# Patient Record
Sex: Female | Born: 1990 | Race: Black or African American | Hispanic: No | Smoking: Former smoker
Health system: Southern US, Community
[De-identification: ages and names within clinical notes are randomized; demographics above are authoritative.]

## PROBLEM LIST (undated history)

## (undated) ENCOUNTER — Inpatient Hospital Stay (HOSPITAL_COMMUNITY): Payer: Self-pay

## (undated) DIAGNOSIS — Z9889 Other specified postprocedural states: Secondary | ICD-10-CM

## (undated) DIAGNOSIS — R42 Dizziness and giddiness: Secondary | ICD-10-CM

## (undated) DIAGNOSIS — R51 Headache: Secondary | ICD-10-CM

## (undated) DIAGNOSIS — D649 Anemia, unspecified: Secondary | ICD-10-CM

## (undated) DIAGNOSIS — E282 Polycystic ovarian syndrome: Secondary | ICD-10-CM

## (undated) DIAGNOSIS — O09299 Supervision of pregnancy with other poor reproductive or obstetric history, unspecified trimester: Secondary | ICD-10-CM

## (undated) HISTORY — DX: Polycystic ovarian syndrome: E28.2

## (undated) HISTORY — PX: ARM WOUND REPAIR / CLOSURE: SUR1141

## (undated) HISTORY — DX: Dizziness and giddiness: R42

## (undated) HISTORY — PX: INDUCED ABORTION: SHX677

---
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
## 2010-03-17 ENCOUNTER — Emergency Department (HOSPITAL_COMMUNITY)
Admission: EM | Admit: 2010-03-17 | Discharge: 2010-03-17 | Payer: Self-pay | Source: Home / Self Care | Admitting: Family Medicine

## 2010-03-17 LAB — POCT URINALYSIS DIPSTICK
Bilirubin Urine: NEGATIVE
Hgb urine dipstick: NEGATIVE
Nitrite: NEGATIVE
Specific Gravity, Urine: 1.015 (ref 1.005–1.030)
pH: >=9 (ref 5.0–8.0)

## 2010-05-05 ENCOUNTER — Inpatient Hospital Stay (INDEPENDENT_AMBULATORY_CARE_PROVIDER_SITE_OTHER)
Admission: RE | Admit: 2010-05-05 | Discharge: 2010-05-05 | Disposition: A | Discharge status: Home / Self Care | Payer: Self-pay | Source: Ambulatory Visit | Attending: Family Medicine | Admitting: Family Medicine

## 2010-05-05 DIAGNOSIS — K5289 Other specified noninfective gastroenteritis and colitis: Secondary | ICD-10-CM

## 2010-05-05 LAB — POCT URINALYSIS DIPSTICK
Bilirubin Urine: NEGATIVE
Glucose, UA: NEGATIVE mg/dL
Hgb urine dipstick: NEGATIVE
Nitrite: NEGATIVE
Specific Gravity, Urine: 1.02 (ref 1.005–1.030)
pH: 7 (ref 5.0–8.0)

## 2010-05-26 LAB — POCT URINALYSIS DIP (DEVICE)
Bilirubin Urine: NEGATIVE
Glucose, UA: NEGATIVE mg/dL
Hgb urine dipstick: NEGATIVE
Ketones, ur: 15 mg/dL — AB
Specific Gravity, Urine: 1.015 (ref 1.005–1.030)
pH: >=9 (ref 5.0–8.0)

## 2010-05-26 LAB — POCT I-STAT, CHEM 8
BUN: 15 mg/dL (ref 6–23)
Chloride: 107 mEq/L (ref 96–112)
Creatinine, Ser: 0.7 mg/dL (ref 0.4–1.2)
Glucose, Bld: 111 mg/dL — ABNORMAL HIGH (ref 70–99)
Hemoglobin: 16 g/dL — ABNORMAL HIGH (ref 12.0–15.0)
Potassium: 4 mEq/L (ref 3.5–5.1)
Sodium: 144 mEq/L (ref 135–145)

## 2010-05-26 LAB — URINE CULTURE: Colony Count: >=100000

## 2010-09-12 ENCOUNTER — Encounter (HOSPITAL_COMMUNITY): Payer: Self-pay | Admitting: *Deleted

## 2010-09-12 ENCOUNTER — Inpatient Hospital Stay (HOSPITAL_COMMUNITY)
Admission: AD | Admit: 2010-09-12 | Discharge: 2010-09-12 | Disposition: A | Discharge status: Home / Self Care | Payer: Self-pay | Source: Ambulatory Visit | Attending: Obstetrics & Gynecology | Admitting: Obstetrics & Gynecology

## 2010-09-12 DIAGNOSIS — N912 Amenorrhea, unspecified: Secondary | ICD-10-CM | POA: Insufficient documentation

## 2010-09-12 DIAGNOSIS — N949 Unspecified condition associated with female genital organs and menstrual cycle: Secondary | ICD-10-CM | POA: Insufficient documentation

## 2010-09-12 HISTORY — DX: Headache: R51

## 2010-09-12 LAB — URINALYSIS, ROUTINE W REFLEX MICROSCOPIC
Bilirubin Urine: NEGATIVE
Hgb urine dipstick: NEGATIVE
Ketones, ur: NEGATIVE mg/dL
Nitrite: NEGATIVE
Specific Gravity, Urine: 1.01 (ref 1.005–1.030)
Urobilinogen, UA: 0.2 mg/dL (ref 0.0–1.0)

## 2010-09-12 LAB — POCT PREGNANCY, URINE: Preg Test, Ur: NEGATIVE

## 2010-09-12 NOTE — Progress Notes (Signed)
Spotting May 28 after period until June 1 July no bleeding, negative UPT at home

## 2010-09-12 NOTE — Progress Notes (Signed)
Pt spoke to RN on unit and said she had an emergency and she had to leave immediately.  Left without signing@ 2006

## 2010-09-12 NOTE — Progress Notes (Signed)
Pt c/o clear, mucous like, stringy discharge and concerned that she has not had a cycle since the 14thof May

## 2010-12-22 ENCOUNTER — Ambulatory Visit
Admission: RE | Admit: 2010-12-22 | Discharge: 2010-12-22 | Disposition: A | Discharge status: Home / Self Care | Payer: PRIVATE HEALTH INSURANCE | Source: Ambulatory Visit | Attending: Emergency Medicine | Admitting: Emergency Medicine

## 2010-12-22 ENCOUNTER — Other Ambulatory Visit: Payer: Self-pay | Admitting: Emergency Medicine

## 2010-12-22 DIAGNOSIS — E282 Polycystic ovarian syndrome: Secondary | ICD-10-CM

## 2011-03-08 ENCOUNTER — Encounter (HOSPITAL_COMMUNITY): Payer: Self-pay | Admitting: *Deleted

## 2011-03-08 ENCOUNTER — Inpatient Hospital Stay (HOSPITAL_COMMUNITY)
Admission: AD | Admit: 2011-03-08 | Discharge: 2011-03-08 | Disposition: A | Discharge status: Home / Self Care | Payer: PRIVATE HEALTH INSURANCE | Source: Ambulatory Visit | Attending: Obstetrics and Gynecology | Admitting: Obstetrics and Gynecology

## 2011-03-08 DIAGNOSIS — Z3201 Encounter for pregnancy test, result positive: Secondary | ICD-10-CM | POA: Insufficient documentation

## 2011-03-08 DIAGNOSIS — J45909 Unspecified asthma, uncomplicated: Secondary | ICD-10-CM | POA: Insufficient documentation

## 2011-03-08 DIAGNOSIS — O09899 Supervision of other high risk pregnancies, unspecified trimester: Secondary | ICD-10-CM

## 2011-03-08 NOTE — Progress Notes (Signed)
Pt states she has had 3 positive preg test at home and she just wants to make sure and "get the information that I need because I haven't been preg before"

## 2011-03-08 NOTE — ED Provider Notes (Signed)
History   Ann Dunn is a 20y.o. Black female who presents unannounced at 4.6 weeks per LMP 02/02/11 for "pregnancy confirmation."  Has had "3" positive UPT's at home.  Denies VB, abnl d/c, LOF.  Reports occasional lower abdominal, menstrual-like "cramps."  Accompanied by her significant other.  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  Not in school.  Denies any significant PMH except asthma.  Only sx hx is EAB in past.  Dr. Estanislado Pandy is her gynecologist at [facility].  Called and spoke w/ "Vernona Rieger," r/e pos pregnancy test and pt was told the office would schedule her an appt around 6-10 weeks.  Denies cig, illegal drug use, or alcohol.  Taking an OTC PNV.  Pt's significant other has children already.  Denies morning sickness or UTI s/s.  Some constipation--last bm small today.    Chief Complaint  Patient presents with  . Possible Pregnancy   HPI  OB History    Grav Para Term Preterm Abortions TAB SAB Ect Mult Living   2    1 1     0      Past Medical History  Diagnosis Date  . Infection   . Asthma   . Headache     History reviewed. No pertinent past surgical history.  Family History  Problem Relation Age of Onset  . Stroke Maternal Grandmother     History  Substance Use Topics  . Smoking status: Current Everyday Smoker -- 0.2 packs/day  . Smokeless tobacco: Not on file  . Alcohol Use: 0.5 oz/week    1 drink(s) per week    Allergies: No Known Allergies  Prescriptions prior to admission  Medication Sig Dispense Refill  . Prenatal Vit-Fe Fumarate-FA (PRENATAL MULTIVITAMIN) TABS Take 1 tablet by mouth daily.        ROS--see HPI Physical Exam   Blood pressure 119/67, pulse 91, temperature 99.1 F (37.3 C), resp. rate 18, height 5' (1.524 m), weight 62.143 kg (137 lb), last menstrual period 02/02/2011, SpO2 97.00%.  Physical Exam  Constitutional: She is oriented to person, place, and time. She appears well-developed and well-nourished.  No distress.  Cardiovascular: Normal rate.   Respiratory: Effort normal.  Neurological: She is alert and oriented to person, place, and time.  Skin: Skin is warm and dry.    MAU Course  Procedures 1.  UPT--positive  Assessment and Plan  1.  Early pregnant--4.6 weeks per LMP 2.  Lack of knowledge r/e pregnancy condition 3.  Stable asthma 4.  Desiring pregnancy confirmation  1.  D/c home w/ several educational handouts r/e pregnancy, 1st trimester, etc 2.  SAB precautions rev'd 3.  Rec'd healthy lifestyle, adeq H2O, fruits and vegetables, small freq meals; Routines rev'd 4.  Office to call and schedule her NOB interview and workup, or pt to f/u prn 5.  Reports "[facility]," but desires confirmation to take info to "social services" 6.  Disc'd with pt, further testing in absence of worrisome s/s, not rec'd at her gestation  Antonietta Breach 03/08/2011, 11:02 PM

## 2011-04-03 ENCOUNTER — Encounter (HOSPITAL_COMMUNITY): Payer: Self-pay | Admitting: *Deleted

## 2011-04-03 ENCOUNTER — Inpatient Hospital Stay (HOSPITAL_COMMUNITY): Payer: PRIVATE HEALTH INSURANCE

## 2011-04-03 ENCOUNTER — Inpatient Hospital Stay (HOSPITAL_COMMUNITY)
Admission: AD | Admit: 2011-04-03 | Discharge: 2011-04-03 | Disposition: A | Discharge status: Home / Self Care | Payer: PRIVATE HEALTH INSURANCE | Source: Ambulatory Visit | Attending: Obstetrics & Gynecology | Admitting: Obstetrics & Gynecology

## 2011-04-03 DIAGNOSIS — J45909 Unspecified asthma, uncomplicated: Secondary | ICD-10-CM | POA: Insufficient documentation

## 2011-04-03 DIAGNOSIS — IMO0002 Reserved for concepts with insufficient information to code with codable children: Secondary | ICD-10-CM

## 2011-04-03 DIAGNOSIS — O021 Missed abortion: Secondary | ICD-10-CM | POA: Insufficient documentation

## 2011-04-03 DIAGNOSIS — R1084 Generalized abdominal pain: Secondary | ICD-10-CM

## 2011-04-03 DIAGNOSIS — F17200 Nicotine dependence, unspecified, uncomplicated: Secondary | ICD-10-CM | POA: Insufficient documentation

## 2011-04-03 DIAGNOSIS — F1010 Alcohol abuse, uncomplicated: Secondary | ICD-10-CM | POA: Insufficient documentation

## 2011-04-03 LAB — WET PREP, GENITAL
Clue Cells Wet Prep HPF POC: NONE SEEN
Trich, Wet Prep: NONE SEEN

## 2011-04-03 LAB — CBC
HCT: 35.6 % — ABNORMAL LOW (ref 36.0–46.0)
Platelets: 265 10*3/uL (ref 150–400)
RBC: 4.3 MIL/uL (ref 3.87–5.11)
RDW: 13.5 % (ref 11.5–15.5)
WBC: 10.4 10*3/uL (ref 4.0–10.5)

## 2011-04-03 LAB — ABO/RH: ABO/RH(D): O POS

## 2011-04-03 NOTE — ED Provider Notes (Signed)
History     CSN: [id number]  Arrival date & time 04/03/11  1007   None     No chief complaint on file.  HPI Ann Dunn is a 21 y.o. female @ [redacted]w[redacted]d gestation who presents to MAU for pregnancy follow up. States she had an appointment with [facility]/OB today for ultrasound and to start prenatal care. She was late for her appointment and told she would need to reschedule for 2 weeks. She asked for her records and told them she would not be getting her care with them. She comes to MAU stating that she has had cramping in her lower abdomen and needs to be evaluated. She denies vaginal bleeding.   Past Medical History  Diagnosis Date  . Infection   . Asthma   . Headache     Past Surgical History  Procedure Date  . Arm wound repair / closure   . Induced abortion     Family History  Problem Relation Age of Onset  . Stroke Maternal Grandmother     History  Substance Use Topics  . Smoking status: Current Everyday Smoker -- 0.2 packs/day  . Smokeless tobacco: Not on file  . Alcohol Use: 0.5 oz/week    1 drink(s) per week    OB History    Grav Para Term Preterm Abortions TAB SAB Ect Mult Living   2    1 1     0      Review of Systems  Constitutional: Negative for fever, chills, diaphoresis and fatigue.  HENT: Negative for ear pain, congestion, sore throat, facial swelling, neck pain, neck stiffness, dental problem and sinus pressure.   Eyes: Negative for photophobia, pain and discharge.  Respiratory: Negative for cough, chest tightness and wheezing.   Gastrointestinal: Positive for nausea and abdominal pain (cramping). Negative for vomiting, diarrhea, constipation and abdominal distention.  Genitourinary: Positive for frequency and pelvic pain. Negative for dysuria, flank pain, vaginal bleeding, vaginal discharge, difficulty urinating and vaginal pain.  Musculoskeletal: Negative for myalgias, back pain and gait problem.  Skin: Negative for color change and rash.  Neurological:  Negative for dizziness, speech difficulty, weakness, light-headedness, numbness and headaches.  Psychiatric/Behavioral: Negative for confusion and agitation. The patient is not nervous/anxious.     Allergies  Review of patient's allergies indicates no known allergies.  Home Medications  No current outpatient prescriptions on file.  BP 122/72  Pulse 65  Temp(Src) 97.1 F (36.2 C) (Oral)  Resp 18  Ht 5' (1.524 m)  Wt 129 lb (58.514 kg)  BMI 25.19 kg/m2  LMP 02/02/2011  Physical Exam  Nursing note and vitals reviewed. Constitutional: She is oriented to person, place, and time. She appears well-developed and well-nourished.  HENT:  Head: Normocephalic.  Eyes: EOM are normal.  Neck: Neck supple.  Cardiovascular: Normal rate.   Pulmonary/Chest: Effort normal.  Abdominal: Soft. There is no tenderness.  Genitourinary:       External genitalia without lesions. White discharge vaginal vault. Cervix closed, long. No CMT, no adnexal tenderness or mass palpated. Uterus slightly enlarged.   Musculoskeletal: Normal range of motion.  Neurological: She is alert and oriented to person, place, and time. No cranial nerve deficit.  Skin: Skin is warm and dry.  Psychiatric: She has a normal mood and affect. Her behavior is normal. Judgment and thought content normal.   Results for orders placed during the hospital encounter of 04/03/11 (from the past 24 hour(s))  CBC     Status: Abnormal  Collection Time   04/03/11 11:27 AM      Component Value Range   WBC 10.4  4.0 - 10.5 (K/uL)   RBC 4.30  3.87 - 5.11 (MIL/uL)   Hemoglobin 11.7 (*) 12.0 - 15.0 (g/dL)   HCT 16.1 (*) 09.6 - 46.0 (%)   MCV 82.8  78.0 - 100.0 (fL)   MCH 27.2  26.0 - 34.0 (pg)   MCHC 32.9  30.0 - 36.0 (g/dL)   RDW 04.5  40.9 - 81.1 (%)   Platelets 265  150 - 400 (K/uL)  ABO/RH     Status: Normal   Collection Time   04/03/11 11:30 AM      Component Value Range   ABO/RH(D) O POS    HCG, QUANTITATIVE, PREGNANCY     Status:  Abnormal   Collection Time   04/03/11 11:35 AM      Component Value Range   hCG, Beta Chain, Quant, S 91478 (*) <5 (mIU/mL)   Ultrasound today shows a 7 week 1 day IUFD.  Assessment: IUFD @ 7 weeks gest  Plan: Discussed options with patient and she wants to do expectant management at this time. She will follow up in 2 weeks or return sooner for problems.  ED Course  Procedures  MDM          Kerrie Buffalo, NP 04/03/11 1323

## 2011-04-03 NOTE — Progress Notes (Signed)
I'm preg, scheduled appt today with [facility].  Was late for appt, had to reschedule appt for 2 wks.  Pt is resigning from their practice and looking for another doctor.  Was to have had [place] today.

## 2011-04-04 LAB — GC/CHLAMYDIA PROBE AMP, GENITAL
Chlamydia, DNA Probe: NEGATIVE
GC Probe Amp, Genital: NEGATIVE

## 2011-04-13 ENCOUNTER — Encounter (HOSPITAL_COMMUNITY): Payer: Self-pay | Admitting: *Deleted

## 2011-04-13 ENCOUNTER — Inpatient Hospital Stay (HOSPITAL_COMMUNITY)
Admission: AD | Admit: 2011-04-13 | Discharge: 2011-04-13 | Disposition: A | Discharge status: Home / Self Care | Payer: PRIVATE HEALTH INSURANCE | Attending: Obstetrics and Gynecology | Admitting: Obstetrics and Gynecology

## 2011-04-13 DIAGNOSIS — O021 Missed abortion: Secondary | ICD-10-CM

## 2011-04-13 LAB — CBC
Hemoglobin: 11.7 g/dL — ABNORMAL LOW (ref 12.0–15.0)
MCH: 27.3 pg (ref 26.0–34.0)
RBC: 4.29 MIL/uL (ref 3.87–5.11)

## 2011-04-13 MED ORDER — HYDROCODONE-ACETAMINOPHEN 5-500 MG PO TABS: 1.0000 | ORAL_TABLET | ORAL | Status: AC | PRN

## 2011-04-13 MED ORDER — IBUPROFEN 600 MG PO TABS
600.0000 mg | ORAL_TABLET | Freq: Once | ORAL | Status: AC
  Administered 2011-04-13: 600 mg via ORAL
  Filled 2011-04-13: qty 1

## 2011-04-13 MED ORDER — ONDANSETRON 8 MG PO TBDP: 8.0000 mg | ORAL_TABLET | Freq: Three times a day (TID) | ORAL | Status: AC | PRN

## 2011-04-13 MED ORDER — HYDROCODONE-ACETAMINOPHEN 5-325 MG PO TABS
1.0000 | ORAL_TABLET | Freq: Once | ORAL | Status: AC
  Administered 2011-04-13: 1 via ORAL
  Filled 2011-04-13: qty 1

## 2011-04-13 NOTE — Discharge Instructions (Signed)
Miscarriage (Spontaneous Miscarriage) A miscarriage is when you lose your baby before the twentieth week of pregnancy. Miscarriages happen in 15-20% of pregnancies. Most miscarriages happen in the first 13 weeks of the pregnancy. In medical terms, this is called a spontaneous miscarriage or early pregnancy loss. No further treatment is needed when the miscarriage is complete and all products of conception have been passed out of the body. You can begin trying for another pregnancy as soon as your caregiver says it is okay. CAUSES   Most causes are not known.   Genetic problems like abnormal, not enough or too many chromosomes.   Infection of the cervix or uterus.   An abnormal shaped uterus, fibroid tumors or congenital abnormalities.   Hormone problems.   Medical problems.   Incompetent cervix, the tissue in the cervix is not strong enough to hold the pregnancy.   Smoking, too much alcohol use and illegal drugs.   Trauma.  SYMPTOMS   Bleeding or spotting from the vagina.   Cramping of the lower abdomen.   Passing of fluid from the vagina with or without cramps or pain.   Passing fetal tissue.  TREATMENT   Sometimes no further treatment is necessary if you pass all the tissue in the uterus.   If partial parts of the fetus or placenta remain in the body (incomplete miscarriage), tissue left behind may become infected. Usually a D and C (Dilatation and Curettage) suction or scrapping of the uterus is necessary to remove the remaining tissue in uterus. The procedure is only done when your caregiver knows that there is no chance for the pregnancy to continue. This is determined by a physical exam, a negative pregnancy test, blood tests and perhaps an ultrasound revealing a dead fetus or no fetus developing because a problem occurred at conception (when the sperm and egg unite).   Medications may be necessary, antibiotics if there is an infection or medications to contract the uterus  if there is a lot of bleeding.   If you have Rh negative blood and your partner is Rh positive, you will need a Rho-gam shot (an immune globulin vaccine). This will protect your baby from having Rh blood problems in future pregnancies.  HOME CARE INSTRUCTIONS   Your caregiver may order bed rest (up to the bathroom only). He or she may allow you to continue light activity. You may need to make arrangements for the care of children and for any other responsibilities.   Keep track of the number of pads you use each day and how soaked (saturated) they are. Record this information.   Do not use tampons. Do not douche or have sexual intercourse until approved by your caregiver.   Only take over-the-counter or prescription medicines for pain, discomfort or fever as directed by your caregiver.   Do not take aspirin because it can cause bleeding.   It is very important to keep all follow-up appointments for re-evaluations and continuing management.   Tell your caregiver if you are experiencing domestic violence.   Women who have an Rh negative blood type (i.e., A, B, AB, or O negative) need to receive a drug called Rh(D) immune globulin (RhoGam). This medicine helps protect future fetuses against problems that can occur if an Rh negative mother is carrying a baby who is Rh positive.   If you and/or your partner are having problems with guilt or grieving, talk to your caregiver or seek counseling to help you cope with the pregnancy loss.   Allow enough time to grieve before trying to get pregnant again.  SEEK IMMEDIATE MEDICAL CARE IF:   You have severe cramps or pain in your stomach, back, or belly (abdomen).   You have a fever.   You pass large clots or tissue. Save any tissue for your caregiver to inspect.   Your bleeding increases.   You become light-headed, weak or have fainting episodes.   You develop chills.  Document Released: 08/02/2000 Document Revised: 10/19/2010 Document Reviewed:  09/09/2007 Exit[employer] Patient Information 2012 [employer]. 

## 2011-04-13 NOTE — ED Provider Notes (Signed)
History   Ann Dunn is a 20y.o. Single black female at [redacted] weeks gestation who presents unannounced via EMS w/ CC of heavy vaginal bleeding and painful cramping s/p vaginal cytotec at 1930 for induction of MAB.  IUFD dx'd on 04/03/11 in MAU (fetal pole measuring 7.1 weeks), and pt had f/u appt w/ Dr. Estanislado Pandy yesterday and chose to proceed w/ cytotec.  Pt reports heaving vaginal bleeding, but unsure true amt b/c pt didn't have any feminine pads at home to use, and was evaluating bleeding based on "rolled up tissue paper" she had placed in underwear.  She also reports "diarrhea" several times since cytotec taken.  Denies any recent fever or illness.  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  Pt reports taking "one" ibuprofen OTC tablet around 11pm.  She denies any dizziness or light-headedness.  States decreased urge to void since cytotec last night.  Pt had negative Gc/CT cx's and negative wet prep on 04/03/11.  Blood type is O pos.  Prior pregnancy was EAB.  Last saw pt myself for pregnancy confirmation just before 5 weeks in MAU.  At that time had c/o intermittent menstrual-like cramping, but no VB.  Pt's boyfriend did eventually present to bedside in MAU.    Chief Complaint  Patient presents with  . Abdominal Pain  . Vaginal Bleeding   HPI  OB History    Grav Para Term Preterm Abortions TAB SAB Ect Mult Living   2    1 1     0      Past Medical History  Diagnosis Date  . Infection   . Asthma   . Headache     Past Surgical History  Procedure Date  . Arm wound repair / closure   . Induced abortion     Family History  Problem Relation Age of Onset  . Stroke Maternal Grandmother     History  Substance Use Topics  . Smoking status: Current Everyday Smoker -- 0.2 packs/day  . Smokeless tobacco: Not on file  . Alcohol Use: 0.5 oz/week    1 drink(s) per week    Allergies: No Known Allergies  Prescriptions prior to admission  Medication  Sig Dispense Refill  . ibuprofen (ADVIL,MOTRIN) 200 MG tablet Take 400 mg by mouth every 6 (six) hours as needed. For pain      . Prenatal Vit-Fe Fumarate-FA (PRENATAL MULTIVITAMIN) TABS Take 1 tablet by mouth daily.      Marland Kitchen PRESCRIPTION MEDICATION Place 800 mcg vaginally once. Cytotec vaginal suppository inserted as directed by MD      . fluticasone (FLOVENT HFA) 110 MCG/ACT inhaler Inhale 1 puff into the lungs 2 (two) times daily as needed. Wheezing/shortness of breath        ROS--see history above Physical Exam  .. Results for orders placed during the hospital encounter of 04/13/11 (from the past 24 hour(s))  CBC     Status: Abnormal   Collection Time   04/13/11  1:35 AM      Component Value Range   WBC 17.9 (*) 4.0 - 10.5 (K/uL)   RBC 4.29  3.87 - 5.11 (MIL/uL)   Hemoglobin 11.7 (*) 12.0 - 15.0 (g/dL)   HCT 16.1 (*) 09.6 - 46.0 (%)   MCV 82.8  78.0 - 100.0 (fL)   MCH 27.3  26.0 - 34.0 (pg)   MCHC 33.0  30.0 - 36.0 (g/dL)   RDW 04.5  40.9 - 81.1 (%)  Platelets 222  150 - 400 (K/uL)   Blood pressure 123/75, pulse 71, temperature 97.3 F (36.3 C), temperature source Oral, resp. rate 16, last menstrual period 02/02/2011.  Physical Exam  Constitutional: She is oriented to person, place, and time. She appears well-developed and well-nourished. No distress.  Cardiovascular: Normal rate.   Respiratory: Effort normal.  GI: Soft. Bowel sounds are normal. She exhibits no distension and no mass. There is no tenderness. There is no rebound and no guarding.  Genitourinary:       SSE:  lg amt of tissue in os (POC) and removed w/ ring forceps and sent to path. Following removal, oozing noted from os. Continued Pad count for 3+ hours following Spec exam, and bleeding to slow down over time and cramping improved after Motrin  Neurological: She is alert and oriented to person, place, and time.  Skin: Skin is warm and dry.    MAU Course  Procedures 1.  Spec exam 2.  CBC 3.  Motrin 600mg   po x1 4.  Removal of large amt of POC from cervical os and sent to path 5.  Pad count for 3-4 hrs post Spec exam  Assessment and Plan  1.  MAB 2.  Induction w/ Cytotec last night 3.  CBC stable and VB decreased w/ time in MAU 4.  Cramping improved w/ Motrin 5.  Rh pos  1.  D/c'd home after several hours of observation and VB stable 2.  Rx called in for Motrin and Zofran 3.  Bleeding precautions rev'd; enc'd pt to have adeq fluid intake and slow movement if dizzy 4.  F/u as scheduled at [facility] or prn worsening s/s, or concerns  Raeanne Deschler H 04/13/2011, 1:55 AM

## 2011-04-13 NOTE — Progress Notes (Signed)
Pt took cytotec at 1930 this evening for MAB, having lower abd cramping and vag bleeding.  Pt took ibuprofen for pain at 2300.  Pt arrived via EMS.

## 2011-04-13 NOTE — ED Notes (Signed)
H. Steelman [name] in to see pt. 

## 2011-04-27 ENCOUNTER — Encounter (INDEPENDENT_AMBULATORY_CARE_PROVIDER_SITE_OTHER): Payer: Medicaid Other | Admitting: Obstetrics and Gynecology

## 2011-04-27 DIAGNOSIS — O035 Genital tract and pelvic infection following complete or unspecified spontaneous abortion: Secondary | ICD-10-CM

## 2011-07-07 ENCOUNTER — Encounter (HOSPITAL_COMMUNITY): Payer: Self-pay | Admitting: Emergency Medicine

## 2011-07-07 ENCOUNTER — Emergency Department (INDEPENDENT_AMBULATORY_CARE_PROVIDER_SITE_OTHER)
Admission: EM | Admit: 2011-07-07 | Discharge: 2011-07-07 | Disposition: A | Discharge status: Home / Self Care | Payer: PRIVATE HEALTH INSURANCE | Source: Home / Self Care | Attending: Family Medicine | Admitting: Family Medicine

## 2011-07-07 DIAGNOSIS — IMO0002 Reserved for concepts with insufficient information to code with codable children: Secondary | ICD-10-CM

## 2011-07-07 DIAGNOSIS — N941 Unspecified dyspareunia: Secondary | ICD-10-CM

## 2011-07-07 LAB — POCT URINALYSIS DIP (DEVICE)
Glucose, UA: NEGATIVE mg/dL
Hgb urine dipstick: NEGATIVE
Specific Gravity, Urine: >=1.03 (ref 1.005–1.030)

## 2011-07-07 MED ORDER — DICLOFENAC POTASSIUM 50 MG PO TABS: 50.0000 mg | ORAL_TABLET | Freq: Three times a day (TID) | ORAL | Status: DC

## 2011-07-07 NOTE — Discharge Instructions (Signed)
Use medicine as needed , drink plenty of fluids and see your doctor if further problems.

## 2011-07-07 NOTE — ED Notes (Signed)
Pt having "contraction like" pain, and vomiting, dark urine, and frequency. No dysuria.
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
## 2011-10-31 ENCOUNTER — Telehealth: Payer: Self-pay | Admitting: Obstetrics and Gynecology

## 2011-10-31 NOTE — Telephone Encounter (Signed)
[name]/APPT REQ

## 2011-10-31 NOTE — Telephone Encounter (Signed)
LMP 09-24-11  5 w 2 d today.   EDC of May 12,2014. Pt stated you wanted to know as soon as she had a positive UPT.  Had SAB in Feb.  ld

## 2011-10-31 NOTE — Telephone Encounter (Signed)
LM for pt to [facility].  ld

## 2011-10-31 NOTE — Telephone Encounter (Signed)
[name]/RETURN CALL

## 2011-11-02 ENCOUNTER — Telehealth: Payer: Self-pay | Admitting: Obstetrics and Gynecology

## 2011-11-02 NOTE — Telephone Encounter (Signed)
Needs quant HCG and progesterone

## 2011-11-03 ENCOUNTER — Other Ambulatory Visit: Payer: Self-pay

## 2011-11-03 ENCOUNTER — Telehealth: Payer: Self-pay

## 2011-11-03 DIAGNOSIS — Z3201 Encounter for pregnancy test, result positive: Secondary | ICD-10-CM

## 2011-11-03 NOTE — Telephone Encounter (Signed)
Needs quant and progesterone level done per SR.  LM for pt that labs need to be scheduled.  ld

## 2011-11-03 NOTE — Telephone Encounter (Signed)
Quant and prog per SR.  Pt to come to lab today. ld

## 2011-11-04 LAB — HCG, QUANTITATIVE, PREGNANCY: hCG, Beta Chain, Quant, S: 19362.3 m[IU]/mL

## 2011-11-04 LAB — PROGESTERONE: Progesterone: 28.1 ng/mL

## 2011-11-05 ENCOUNTER — Inpatient Hospital Stay (HOSPITAL_COMMUNITY)
Admission: AD | Admit: 2011-11-05 | Discharge: 2011-11-05 | Disposition: A | Discharge status: Home / Self Care | Payer: Medicaid Other | Source: Ambulatory Visit | Attending: Obstetrics & Gynecology | Admitting: Obstetrics & Gynecology

## 2011-11-05 ENCOUNTER — Encounter (HOSPITAL_COMMUNITY): Payer: Self-pay | Admitting: *Deleted

## 2011-11-05 DIAGNOSIS — Z3201 Encounter for pregnancy test, result positive: Secondary | ICD-10-CM

## 2011-11-05 NOTE — MAU Provider Note (Signed)
  History     CSN: [id number]  Arrival date and time: 11/05/11 1109   First Provider Initiated Contact with Patient 11/05/11 1141      Chief Complaint  Patient presents with  . Possible Pregnancy   HPI Ann Dunn 21 y.o. [redacted]w[redacted]d  Here for pregnancy confirmation only.  No pain.  No bleeding.  Needs to apply for Medicaid.  OB History    Grav Para Term Preterm Abortions TAB SAB Ect Mult Living   3    2 1 1    0      Past Medical History  Diagnosis Date  . Infection   . Asthma   . Headache     Past Surgical History  Procedure Date  . Arm wound repair / closure   . Induced abortion     Family History  Problem Relation Age of Onset  . Stroke Maternal Grandmother     History  Substance Use Topics  . Smoking status: Current Every Day Smoker -- 0.2 packs/day  . Smokeless tobacco: Not on file  . Alcohol Use: 0.5 oz/week    1 drink(s) per week    Allergies: No Known Allergies  Prescriptions prior to admission  Medication Sig Dispense Refill  . Prenatal Vit-Fe Fumarate-FA (PRENATAL MULTIVITAMIN) TABS Take 1 tablet by mouth daily.      . fluticasone (FLOVENT HFA) 110 MCG/ACT inhaler Inhale 1 puff into the lungs 2 (two) times daily as needed. Wheezing/shortness of breath        Review of Systems  Constitutional: Negative for fever.  Gastrointestinal: Positive for nausea. Negative for vomiting and abdominal pain.  Genitourinary:       No vaginal discharge. No vaginal bleeding. No dysuria.   Physical Exam   Blood pressure 126/65, pulse 85, temperature 98.1 F (36.7 C), temperature source Oral, resp. rate 18, height 5' 0.25" (1.53 m), weight 63.957 kg (141 lb), last menstrual period 09/24/2011.  Physical Exam  Nursing note and vitals reviewed. Constitutional: She is oriented to person, place, and time. She appears well-developed and well-nourished. No distress.  HENT:  Head: Normocephalic.  Eyes: EOM are normal.  Neck: Neck supple.  Musculoskeletal: Normal  range of motion.  Neurological: She is alert and oriented to person, place, and time.  Skin: Skin is warm and dry.  Psychiatric: She has a normal mood and affect.    MAU Course  Procedures Results for orders placed during the hospital encounter of 11/05/11 (from the past 24 hour(s))  POCT PREGNANCY, URINE     Status: Abnormal   Collection Time   11/05/11 11:24 AM      Component Value Range   Preg Test, Ur POSITIVE (*) NEGATIVE   MDM   Assessment and Plan  Positive pregnancy test  Plan Begin prenatal care as soon as possible. Pregnancy verification form given  BURLESON,TERRI 11/05/2011, 12:10 PM

## 2011-11-05 NOTE — MAU Note (Signed)
Pt here for pregnancy confirmation. Had miscarriage back in Feb

## 2011-11-06 ENCOUNTER — Telehealth: Payer: Self-pay

## 2011-11-06 DIAGNOSIS — O26849 Uterine size-date discrepancy, unspecified trimester: Secondary | ICD-10-CM

## 2011-11-06 NOTE — Telephone Encounter (Signed)
Pt notified of lab results.  Pt scheduled for u/s on 11-15-11 with f/u per SR.  Pt agreeable.  ld

## 2011-11-06 NOTE — Telephone Encounter (Signed)
Message copied by Larwance Rote on Mon Nov 06, 2011  8:58 AM ------      Message from: Ann Dunn      Created: Sat Nov 04, 2011  3:39 PM       Please call pt to inform labs are great. Schedule dating ultrasound at 7 weeks per LMP

## 2011-11-15 ENCOUNTER — Encounter: Payer: Self-pay | Admitting: Obstetrics and Gynecology

## 2011-11-15 ENCOUNTER — Other Ambulatory Visit: Payer: Self-pay | Admitting: Obstetrics and Gynecology

## 2011-11-15 ENCOUNTER — Ambulatory Visit (INDEPENDENT_AMBULATORY_CARE_PROVIDER_SITE_OTHER): Payer: Medicaid Other

## 2011-11-15 ENCOUNTER — Ambulatory Visit (INDEPENDENT_AMBULATORY_CARE_PROVIDER_SITE_OTHER): Payer: Medicaid Other | Admitting: Obstetrics and Gynecology

## 2011-11-15 VITALS — BP 100/62 | Wt 141.0 lb

## 2011-11-15 DIAGNOSIS — O26849 Uterine size-date discrepancy, unspecified trimester: Secondary | ICD-10-CM

## 2011-11-15 DIAGNOSIS — F1290 Cannabis use, unspecified, uncomplicated: Secondary | ICD-10-CM

## 2011-11-15 DIAGNOSIS — J45909 Unspecified asthma, uncomplicated: Secondary | ICD-10-CM

## 2011-11-15 DIAGNOSIS — F1210 Cannabis abuse, uncomplicated: Secondary | ICD-10-CM

## 2011-11-15 LAB — US OB TRANSVAGINAL

## 2011-11-15 NOTE — Progress Notes (Signed)
[redacted]w[redacted]d Dating u/s today  Needs NOB interview and work up NOB bag with pnv samples given today

## 2011-11-15 NOTE — Progress Notes (Signed)
[place] today for dating:  SIUP, 6 5/7 weeks, EDC 07/06/11, c/w dates.  FHR 138.  Normal ovaries and adenexa. Per patient, cycles have been 30-32 days, regular. Will keep EDC per LMP, 06/30/12. Schedule NOB interview in 2-3 weeks.

## 2011-11-27 ENCOUNTER — Inpatient Hospital Stay (HOSPITAL_COMMUNITY)
Admission: AD | Admit: 2011-11-27 | Discharge: 2011-11-27 | Disposition: A | Discharge status: Home / Self Care | Payer: Medicaid Other | Source: Ambulatory Visit | Attending: Obstetrics and Gynecology | Admitting: Obstetrics and Gynecology

## 2011-11-27 ENCOUNTER — Encounter (HOSPITAL_COMMUNITY): Payer: Self-pay | Admitting: *Deleted

## 2011-11-27 DIAGNOSIS — K5900 Constipation, unspecified: Secondary | ICD-10-CM | POA: Insufficient documentation

## 2011-11-27 DIAGNOSIS — O99330 Smoking (tobacco) complicating pregnancy, unspecified trimester: Secondary | ICD-10-CM

## 2011-11-27 DIAGNOSIS — O99891 Other specified diseases and conditions complicating pregnancy: Secondary | ICD-10-CM | POA: Insufficient documentation

## 2011-11-27 DIAGNOSIS — O09299 Supervision of pregnancy with other poor reproductive or obstetric history, unspecified trimester: Secondary | ICD-10-CM

## 2011-11-27 DIAGNOSIS — Z9889 Other specified postprocedural states: Secondary | ICD-10-CM | POA: Insufficient documentation

## 2011-11-27 DIAGNOSIS — F1290 Cannabis use, unspecified, uncomplicated: Secondary | ICD-10-CM

## 2011-11-27 DIAGNOSIS — R109 Unspecified abdominal pain: Secondary | ICD-10-CM

## 2011-11-27 DIAGNOSIS — J45909 Unspecified asthma, uncomplicated: Secondary | ICD-10-CM

## 2011-11-27 DIAGNOSIS — R1030 Lower abdominal pain, unspecified: Secondary | ICD-10-CM

## 2011-11-27 HISTORY — DX: Supervision of pregnancy with other poor reproductive or obstetric history, unspecified trimester: O09.299

## 2011-11-27 HISTORY — DX: Other specified postprocedural states: Z98.890

## 2011-11-27 LAB — WET PREP, GENITAL
Trich, Wet Prep: NONE SEEN
Yeast Wet Prep HPF POC: NONE SEEN

## 2011-11-27 LAB — URINALYSIS, ROUTINE W REFLEX MICROSCOPIC
Bilirubin Urine: NEGATIVE
Glucose, UA: NEGATIVE mg/dL
Hgb urine dipstick: NEGATIVE
Specific Gravity, Urine: >1.03 — ABNORMAL HIGH (ref 1.005–1.030)
pH: 6 (ref 5.0–8.0)

## 2011-11-27 LAB — GC/CHLAMYDIA PROBE AMP, GENITAL
Chlamydia, DNA Probe: NEGATIVE
GC Probe Amp, Genital: NEGATIVE

## 2011-11-27 MED ORDER — ACETAMINOPHEN 500 MG PO TABS
1000.0000 mg | ORAL_TABLET | Freq: Once | ORAL | Status: AC
  Administered 2011-11-27: 1000 mg via ORAL
  Filled 2011-11-27: qty 2

## 2011-11-27 NOTE — MAU Note (Signed)
Pt reports lower abd cramping since yesterday, pain awakened her this am. Some discomfort with urination. Denies bleeding

## 2011-11-27 NOTE — MAU Provider Note (Signed)
History   Ann Dunn is a 21y.o. BF at [redacted]w[redacted]d who presents unannounced w/ CC of lower abdominal cramping that awoke her out of her sleep this morning.  It had been intermittent since yesterday, but when awoke her and persisted, she got more concerned and came in for eval.  Has tried nothing for cramping.  Hx r/f SAB in Feb 2013.  C/o constipation, occ'l n/v (especially after dairy w/ no previous h/o lactose intolerance).  No fever or illness.  Denies VB.  Works in a "call center."  Has had viability u/s on 9/25 w/ normal progesterone level.   .. Patient Active Problem List  Diagnosis  . Marijuana use  . Asthma  . H/O spontaneous abortion, currently pregnant  . H/O induced abortion  . Smoker    CSN: [id number]  Arrival date and time: 11/27/11 0544   First Provider Initiated Contact with Patient 11/27/11 [phone]      Chief Complaint  Patient presents with  . Abdominal Pain   HPI  OB History    Grav Para Term Preterm Abortions TAB SAB Ect Mult Living   3    2 1 1    0      Past Medical History  Diagnosis Date  . Infection   . Asthma   . Headache     Past Surgical History  Procedure Date  . Arm wound repair / closure   . Induced abortion     Family History  Problem Relation Age of Onset  . Stroke Maternal Grandmother     History  Substance Use Topics  . Smoking status: Current Every Day Smoker -- 0.2 packs/day  . Smokeless tobacco: Not on file  . Alcohol Use: 0.5 oz/week    1 drink(s) per week     dc'd +UPT    Allergies: No Known Allergies  Prescriptions prior to admission  Medication Sig Dispense Refill  . Prenatal Vit-Fe Fumarate-FA (PRENATAL MULTIVITAMIN) TABS Take 1 tablet by mouth daily.      . fluticasone (FLOVENT HFA) 110 MCG/ACT inhaler Inhale 1 puff into the lungs 2 (two) times daily as needed. Wheezing/shortness of breath        ROS--see HPI Physical Exam   Blood pressure 102/54, pulse 75, temperature 98 F (36.7 C), temperature source Oral,  resp. rate 18, height 5' (1.524 m), weight 139 lb (63.05 kg), last menstrual period 09/24/2011, SpO2 100.00%. .. Results for orders placed during the hospital encounter of 11/27/11 (from the past 24 hour(s))  URINALYSIS, ROUTINE W REFLEX MICROSCOPIC     Status: Abnormal   Collection Time   11/27/11  6:25 AM      Component Value Range   Color, Urine YELLOW  YELLOW   APPearance CLOUDY (*) CLEAR   Specific Gravity, Urine >1.030 (*) 1.005 - 1.030   pH 6.0  5.0 - 8.0   Glucose, UA NEGATIVE  NEGATIVE mg/dL   Hgb urine dipstick NEGATIVE  NEGATIVE   Bilirubin Urine NEGATIVE  NEGATIVE   Ketones, ur 15 (*) NEGATIVE mg/dL   Protein, ur NEGATIVE  NEGATIVE mg/dL   Urobilinogen, UA 0.2  0.0 - 1.0 mg/dL   Nitrite NEGATIVE  NEGATIVE   Leukocytes, UA SMALL (*) NEGATIVE  URINE MICROSCOPIC-ADD ON     Status: Abnormal   Collection Time   11/27/11  6:25 AM      Component Value Range   Squamous Epithelial / LPF MANY (*) RARE   WBC, UA 3-6  <3 WBC/hpf   Bacteria, UA  RARE  RARE  WET PREP, GENITAL     Status: Abnormal   Collection Time   11/27/11  6:54 AM      Component Value Range   Yeast Wet Prep HPF POC NONE SEEN  NONE SEEN   Trich, Wet Prep NONE SEEN  NONE SEEN   Clue Cells Wet Prep HPF POC MODERATE (*) NONE SEEN   WBC, Wet Prep HPF POC FEW (*) NONE SEEN   Physical Exam  Constitutional: She is oriented to person, place, and time. She appears well-developed and well-nourished. No distress.       Pt sleeping on my arrival to room initially  HENT:  Head: Normocephalic.  Eyes: Pupils are equal, round, and reactive to light.  Cardiovascular: Normal rate.   Respiratory: Effort normal.  GI: Soft. She exhibits no distension and no mass. There is no tenderness. There is no rebound and no guarding.  Genitourinary:       SSE:  Scant white d/c in vault; no whiff; cx closed  Neurological: She is alert and oriented to person, place, and time.  Skin: Skin is warm and dry.  Psychiatric: Her behavior is  normal. Thought content normal.    MAU Course  Procedures 1.  FHT's via doppler=166 (RLQ) 2.  Wet prep 3.  Gc/ct 4.  U/a 5. Tylenol 1000mg  po x1  Assessment and Plan  1. [redacted]w[redacted]d 2. Lower abdominal cramping 3.  H/o SABx1 & TAB x1 4.  Anxiety r/t previous SAB 5.  Constipation 6.  Inadequate liquid intake  1.  D/c home w/ SAB precautions; ABC's of pregnancy 2.  F/u 12/06/11 for NOB interview and PNL's, or prn 3.  Tylenol, warm compress/bath prn comfort; rec'd 8-10 glasses of water/day; 30mg  Fiber/day 4. Note given to remain OOW until tomorrow  Kimiah Hibner H 11/27/2011, 7:29 AM

## 2011-12-06 ENCOUNTER — Ambulatory Visit (INDEPENDENT_AMBULATORY_CARE_PROVIDER_SITE_OTHER): Payer: Medicaid Other | Admitting: Obstetrics and Gynecology

## 2011-12-06 DIAGNOSIS — Z331 Pregnant state, incidental: Secondary | ICD-10-CM

## 2011-12-06 MED ORDER — CITRANATAL HARMONY 30-1-260 MG PO CAPS: 1.0000 | ORAL_CAPSULE | Freq: Every day | ORAL | Status: DC

## 2011-12-06 MED ORDER — CITRANATAL HARMONY 29-1-265 MG PO CAPS: 1.0000 | ORAL_CAPSULE | Freq: Every day | ORAL | Status: DC

## 2011-12-07 LAB — PRENATAL PANEL VII
Basophils Absolute: 0 10*3/uL (ref 0.0–0.1)
Basophils Relative: 0 % (ref 0–1)
Eosinophils Absolute: 0.1 10*3/uL (ref 0.0–0.7)
Hepatitis B Surface Ag: NEGATIVE
MCH: 26.8 pg (ref 26.0–34.0)
MCHC: 33.2 g/dL (ref 30.0–36.0)
Neutrophils Relative %: 83 % — ABNORMAL HIGH (ref 43–77)
Platelets: 271 10*3/uL (ref 150–400)

## 2011-12-07 LAB — POCT URINALYSIS DIPSTICK
Bilirubin, UA: NEGATIVE
Blood, UA: NEGATIVE
Glucose, UA: NEGATIVE
Nitrite, UA: NEGATIVE

## 2011-12-07 NOTE — Progress Notes (Signed)
NOB interview completed.  Pt informed of ketones in urine and advised to make sure she is pushing fluids and eating well.  Pt requests heart tones today.  Per VL ok, FHR @ 160 bpm.  NOB work up scheduled Thursday 12/28/11 @ 0900 w/ VL.

## 2011-12-08 LAB — HEMOGLOBINOPATHY EVALUATION
Hemoglobin Other: 0 %
Hgb A2 Quant: 2.5 % (ref 2.2–3.2)
Hgb A: 97.5 % (ref 96.8–97.8)
Hgb F Quant: 0 % (ref 0.0–2.0)
Hgb S Quant: 0 %

## 2011-12-08 LAB — CULTURE, OB URINE

## 2011-12-11 ENCOUNTER — Encounter: Payer: Self-pay | Admitting: Obstetrics and Gynecology

## 2011-12-28 ENCOUNTER — Ambulatory Visit (INDEPENDENT_AMBULATORY_CARE_PROVIDER_SITE_OTHER): Payer: Medicaid Other | Admitting: Obstetrics and Gynecology

## 2011-12-28 VITALS — BP 104/68 | Wt 139.0 lb

## 2011-12-28 DIAGNOSIS — Z331 Pregnant state, incidental: Secondary | ICD-10-CM

## 2011-12-28 NOTE — Progress Notes (Signed)
Subjective:    Ann Dunn is being seen today for her first obstetrical visit at [redacted]w[redacted]d gestation by LMP and early [place] at [facility].  She reports doing well, no issues.  Her obstetrical history is significant for: Patient Active Problem List  Diagnosis  . Marijuana use  . Asthma  . H/O spontaneous abortion, currently pregnant  . H/O induced abortion  . Smoker    Relationship with FOB:  Silvestre Mesi, boyfriend--involved and supportive  Feeding plan:   Undecided  Pregnancy history fully reviewed.  The following portions of the patient's history were reviewed and updated as appropriate: allergies, current medications, past family history, past medical history, past social history, past surgical history and problem list.  Review of Systems Pertinent ROS is described in HPI   Objective:   LMP 09/24/2011 Wt Readings from Last 1 Encounters:  11/27/11 139 lb (63.05 kg)   BMI: There is no height or weight on file to calculate BMI.  General: alert, cooperative and no distress Respiratory: clear to auscultation bilaterally Cardiovascular: regular rate and rhythm, S1, S2 normal, no murmur Breasts:  No dominant masses, nipples erect Gastrointestinal: soft, non-tender; no masses,  no organomegaly Extremities: extremities normal, no pain or edema Vaginal Bleeding: None  EXTERNAL GENITALIA: normal appearing vulva with no masses, tenderness or lesions VAGINA: no abnormal discharge or lesions CERVIX: no lesions or cervical motion tenderness; cervix closed, long, firm UTERUS: gravid and consistent with 14 weeks ADNEXA: no masses palpable and nontender OB EXAM PELVIMETRY: appears adequate   FHR:  160  bpm  Assessment:    Pregnancy at  13 4/7 weeks Plan:     Prenatal panel reviewed and discussed with the patient:  yes Pap smear collected:  yes GC/Chlamydia collected:  Done 11/27/11 in MAU Wet prep:  Negative Discussion of Genetic testing options: Plan Quad screen at [place] Prenatal  vitamins recommended Problem list reviewed and updated.  Plan of care: Next visit:  4-5 weeks for ROB and [place] for anatomy Other anticipated f/u:    Quad screen at [facility], [name], [place]

## 2011-12-28 NOTE — Progress Notes (Signed)
Pt is here today for her NOB work-up. Pt stated no issues today.

## 2012-01-01 LAB — PAP IG, CT-NG, RFX HPV ASCU: GC Probe Amp: NEGATIVE

## 2012-01-24 ENCOUNTER — Ambulatory Visit (INDEPENDENT_AMBULATORY_CARE_PROVIDER_SITE_OTHER): Payer: Medicaid Other | Admitting: Obstetrics and Gynecology

## 2012-01-24 ENCOUNTER — Ambulatory Visit (INDEPENDENT_AMBULATORY_CARE_PROVIDER_SITE_OTHER): Payer: Medicaid Other

## 2012-01-24 ENCOUNTER — Encounter: Payer: Self-pay | Admitting: Obstetrics and Gynecology

## 2012-01-24 VITALS — BP 112/64 | Wt 143.0 lb

## 2012-01-24 DIAGNOSIS — Z331 Pregnant state, incidental: Secondary | ICD-10-CM

## 2012-01-24 DIAGNOSIS — Z3689 Encounter for other specified antenatal screening: Secondary | ICD-10-CM

## 2012-01-24 DIAGNOSIS — Q690 Accessory finger(s): Secondary | ICD-10-CM

## 2012-01-24 NOTE — Progress Notes (Signed)
[redacted]w[redacted]d [place] for anatomy S=d cx 3.12 cm vtx pres Posterior placenta FLPK 4.5 cm polydactly noted on left hand.  Can address pp Pt declined genetic testing

## 2012-01-24 NOTE — Patient Instructions (Signed)
ABCs of Pregnancy  A  Antepartum care is very important. Be sure you see your doctor and get prenatal care as soon as you think you are pregnant. At this time, you will be tested for infection, genetic abnormalities and potential problems with you and the pregnancy. This is the time to discuss diet, exercise, work, medications, labor, pain medication during labor and the possibility of a cesarean delivery. Ask any questions that may concern you. It is important to see your doctor regularly throughout your pregnancy. Avoid exposure to toxic substances and chemicals - such as cleaning solvents, lead and mercury, some insecticides, and paint. Pregnant women should avoid exposure to paint fumes, and fumes that cause you to feel ill, dizzy or faint. When possible, it is a good idea to have a pre-pregnancy consultation with your caregiver to begin some important recommendations your caregiver suggests such as, taking folic acid, exercising, quitting smoking, avoiding alcoholic beverages, etc.  B  Breastfeeding is the healthiest choice for both you and your baby. It has many nutritional benefits for the baby and health benefits for the mother. It also creates a very tight and loving bond between the baby and mother. Talk to your doctor, your family and friends, and your employer about how you choose to feed your baby and how they can support you in your decision. Not all birth defects can be prevented, but a woman can take actions that may increase her chance of having a healthy baby. Many birth defects happen very early in pregnancy, sometimes before a woman even knows she is pregnant. Birth defects or abnormalities of any child in your or the father's family should be discussed with your caregiver. Get a good support bra as your breast size changes. Wear it especially when you exercise and when nursing.   C  Celebrate the news of your pregnancy with the your spouse/father and family. Childbirth classes are helpful to  take for you and the spouse/father because it helps to understand what happens during the pregnancy, labor and delivery. Cesarean delivery should be discussed with your doctor so you are prepared for that possibility. The pros and cons of circumcision if it is a boy, should be discussed with your pediatrician. Cigarette smoking during pregnancy can result in low birth weight babies. It has been associated with infertility, miscarriages, tubal pregnancies, infant death (mortality) and poor health (morbidity) in childhood. Additionally, cigarette smoking may cause long-term learning disabilities. If you smoke, you should try to quit before getting pregnant and not smoke during the pregnancy. Secondary smoke may also harm a mother and her developing baby. It is a good idea to ask people to stop smoking around you during your pregnancy and after the baby is born. Extra calcium is necessary when you are pregnant and is found in your prenatal vitamin, in dairy products, green leafy vegetables and in calcium supplements.  D  A healthy diet according to your current weight and height, along with vitamins and mineral supplements should be discussed with your caregiver. Domestic abuse or violence should be made known to your doctor right away to get the situation corrected. Drink more water when you exercise to keep hydrated. Discomfort of your back and legs usually develops and progresses from the middle of the second trimester through to delivery of the baby. This is because of the enlarging baby and uterus, which may also affect your balance. Do not take illegal drugs. Illegal drugs can seriously harm the baby and you. Drink extra   fluids (water is best) throughout pregnancy to help your body keep up with the increases in your blood volume. Drink at least 6 to 8 glasses of water, fruit juice, or milk each day. A good way to know you are drinking enough fluid is when your urine looks almost like clear water or is very light  yellow.   E  Eat healthy to get the nutrients you and your unborn baby need. Your meals should include the five basic food groups. Exercise (30 minutes of light to moderate exercise a day) is important and encouraged during pregnancy, if there are no medical problems or problems with the pregnancy. Exercise that causes discomfort or dizziness should be stopped and reported to your caregiver. Emotions during pregnancy can change from being ecstatic to depression and should be understood by you, your partner and your family.  F  Fetal screening with ultrasound, amniocentesis and monitoring during pregnancy and labor is common and sometimes necessary. Take 400 micrograms of folic acid daily both before, when possible, and during the first few months of pregnancy to reduce the risk of birth defects of the brain and spine. All women who could possibly become pregnant should take a vitamin with folic acid, every day. It is also important to eat a healthy diet with fortified foods (enriched grain products, including cereals, rice, breads, and pastas) and foods with natural sources of folate (orange juice, green leafy vegetables, beans, peanuts, broccoli, asparagus, peas, and lentils). The father should be involved with all aspects of the pregnancy including, the prenatal care, childbirth classes, labor, delivery, and postpartum time. Fathers may also have emotional concerns about being a father, financial needs, and raising a family.  G  Genetic testing should be done appropriately. It is important to know your family and the father's history. If there have been problems with pregnancies or birth defects in your family, report these to your doctor. Also, genetic counselors can talk with you about the information you might need in making decisions about having a family. You can call a major medical center in your area for help in finding a board-certified genetic counselor. Genetic testing and counseling should be done  before pregnancy when possible, especially if there is a history of problems in the mother's or father's family. Certain ethnic backgrounds are more at risk for genetic defects.  H  Get familiar with the hospital where you will be having your baby. Get to know how long it takes to get there, the labor and delivery area, and the hospital procedures. Be sure your medical insurance is accepted there. Get your home ready for the baby including, clothes, the baby's room (when possible), furniture and car seat. Hand washing is important throughout the day, especially after handling raw meat and poultry, changing the baby's diaper or using the bathroom. This can help prevent the spread of many bacteria and viruses that cause infection. Your hair may become dry and thinner, but will return to normal a few weeks after the baby is born. Heartburn is a common problem that can be treated by taking antacids recommended by your caregiver, eating smaller meals 5 or 6 times a day, not drinking liquids when eating, drinking between meals and raising the head of your bed 2 to 3 inches.  I  Insurance to cover you, the baby, doctor and hospital should be reviewed so that you will be prepared to pay any costs not covered by your insurance plan. If you do not have medical insurance,   there are usually clinics and services available for you in your community. Take 30 milligrams of iron during your pregnancy as prescribed by your doctor to reduce the risk of low red blood cells (anemia) later in pregnancy. All women of childbearing age should eat a diet rich in iron.  J  There should be a joint effort for the mother, father and any other children to adapt to the pregnancy financially, emotionally, and psychologically during the pregnancy. Join a support group for moms-to-be. Or, join a class on parenting or childbirth. Have the family participate when possible.  K  Know your limits. Let your caregiver know if you experience any of the  following:   · Pain of any kind.  · Strong cramps.  · You develop a lot of weight in a short period of time (5 pounds in 3 to 5 days).  · Vaginal bleeding, leaking of amniotic fluid.  · Headache, vision problems.  · Dizziness, fainting, shortness of breath.  · Chest pain.  · Fever of 102° F (38.9° C) or higher.  · Gush of clear fluid from your vagina.  · Painful urination.  · Domestic violence.  · Irregular heartbeat (palpitations).  · Rapid beating of the heart (tachycardia).  · Constant feeling sick to your stomach (nauseous) and vomiting.  · Trouble walking, fluid retention (edema).  · Muscle weakness.  · If your baby has decreased activity.  · Persistent diarrhea.  · Abnormal vaginal discharge.  · Uterine contractions at 20-minute intervals.  · Back pain that travels down your leg.  L  Learn and practice that what you eat and drink should be in moderation and healthy for you and your baby. Legal drugs such as alcohol and caffeine are important issues for pregnant women. There is no safe amount of alcohol a woman can drink while pregnant. Fetal alcohol syndrome, a disorder characterized by growth retardation, facial abnormalities, and central nervous system dysfunction, is caused by a woman's use of alcohol during pregnancy. Caffeine, found in tea, coffee, soft drinks and chocolate, should also be limited. Be sure to read labels when trying to cut down on caffeine during pregnancy. More than 200 foods, beverages, and over-the-counter medications contain caffeine and have a high salt content! There are coffees and teas that do not contain caffeine.  M  Medical conditions such as diabetes, epilepsy, and high blood pressure should be treated and kept under control before pregnancy when possible, but especially during pregnancy. Ask your caregiver about any medications that may need to be changed or adjusted during pregnancy. If you are currently taking any medications, ask your caregiver if it is safe to take them  while you are pregnant or before getting pregnant when possible. Also, be sure to discuss any herbs or vitamins you are taking. They are medicines, too! Discuss with your doctor all medications, prescribed and over-the-counter, that you are taking. During your prenatal visit, discuss the medications your doctor may give you during labor and delivery.  N  Never be afraid to ask your doctor or caregiver questions about your health, the progress of the pregnancy, family problems, stressful situations, and recommendation for a pediatrician, if you do not have one. It is better to take all precautions and discuss any questions or concerns you may have during your office visits. It is a good idea to write down your questions before you visit the doctor.  O  Over-the-counter cough and cold remedies may contain alcohol or other ingredients that should   be avoided during pregnancy. Ask your caregiver about prescription, herbs or over-the-counter medications that you are taking or may consider taking while pregnant.   P  Physical activity during pregnancy can benefit both you and your baby by lessening discomfort and fatigue, providing a sense of well-being, and increasing the likelihood of early recovery after delivery. Light to moderate exercise during pregnancy strengthens the belly (abdominal) and back muscles. This helps improve posture. Practicing yoga, walking, swimming, and cycling on a stationary bicycle are usually safe exercises for pregnant women. Avoid scuba diving, exercise at high altitudes (over 3000 feet), skiing, horseback riding, contact sports, etc. Always check with your doctor before beginning any kind of exercise, especially during pregnancy and especially if you did not exercise before getting pregnant.  Q  Queasiness, stomach upset and morning sickness are common during pregnancy. Eating a couple of crackers or dry toast before getting out of bed. Foods that you normally love may make you feel sick to  your stomach. You may need to substitute other nutritious foods. Eating 5 or 6 small meals a day instead of 3 large ones may make you feel better. Do not drink with your meals, drink between meals. Questions that you have should be written down and asked during your prenatal visits.  R  Read about and make plans to baby-proof your home. There are important tips for making your home a safer environment for your baby. Review the tips and make your home safer for you and your baby. Read food labels regarding calories, salt and fat content in the food.  S  Saunas, hot tubs, and steam rooms should be avoided while you are pregnant. Excessive high heat may be harmful during your pregnancy. Your caregiver will screen and examine you for sexually transmitted diseases and genetic disorders during your prenatal visits. Learn the signs of labor. Sexual relations while pregnant is safe unless there is a medical or pregnancy problem and your caregiver advises against it.  T  Traveling long distances should be avoided especially in the third trimester of your pregnancy. If you do have to travel out of state, be sure to take a copy of your medical records and medical insurance plan with you. You should not travel long distances without seeing your doctor first. Most airlines will not allow you to travel after 36 weeks of pregnancy. Toxoplasmosis is an infection caused by a parasite that can seriously harm an unborn baby. Avoid eating undercooked meat and handling cat litter. Be sure to wear gloves when gardening. Tingling of the hands and fingers is not unusual and is due to fluid retention. This will go away after the baby is born.  U  Womb (uterus) size increases during the first trimester. Your kidneys will begin to function more efficiently. This may cause you to feel the need to urinate more often. You may also leak urine when sneezing, coughing or laughing. This is due to the growing uterus pressing against your bladder,  which lies directly in front of and slightly under the uterus during the first few months of pregnancy. If you experience burning along with frequency of urination or bloody urine, be sure to tell your doctor. The size of your uterus in the third trimester may cause a problem with your balance. It is advisable to maintain good posture and avoid wearing high heels during this time. An ultrasound of your baby may be necessary during your pregnancy and is safe for you and your baby.  V    Vaccinations are an important concern for pregnant women. Get needed vaccines before pregnancy. Center for Disease Control (www.cdc.gov) has clear guidelines for the use of vaccines during pregnancy. Review the list, be sure to discuss it with your doctor. Prenatal vitamins are helpful and healthy for you and the baby. Do not take extra vitamins except what is recommended. Taking too much of certain vitamins can cause overdose problems. Continuous vomiting should be reported to your caregiver. Varicose veins may appear especially if there is a family history of varicose veins. They should subside after the delivery of the baby. Support hose helps if there is leg discomfort.  W  Being overweight or underweight during pregnancy may cause problems. Try to get within 15 pounds of your ideal weight before pregnancy. Remember, pregnancy is not a time to be dieting! Do not stop eating or start skipping meals as your weight increases. Both you and your baby need the calories and nutrition you receive from a healthy diet. Be sure to consult with your doctor about your diet. There is a formula and diet plan available depending on whether you are overweight or underweight. Your caregiver or nutritionist can help and advise you if necessary.  X  Avoid X-rays. If you must have dental work or diagnostic tests, tell your dentist or physician that you are pregnant so that extra care can be taken. X-rays should only be taken when the risks of not taking  them outweigh the risk of taking them. If needed, only the minimum amount of radiation should be used. When X-rays are necessary, protective lead shields should be used to cover areas of the body that are not being X-rayed.  Y  Your baby loves you. Breastfeeding your baby creates a loving and very close bond between the two of you. Give your baby a healthy environment to live in while you are pregnant. Infants and children require constant care and guidance. Their health and safety should be carefully watched at all times. After the baby is born, rest or take a nap when the baby is sleeping.  Z  Get your ZZZs. Be sure to get plenty of rest. Resting on your side as often as possible, especially on your left side is advised. It provides the best circulation to your baby and helps reduce swelling. Try taking a nap for 30 to 45 minutes in the afternoon when possible. After the baby is born rest or take a nap when the baby is sleeping. Try elevating your feet for that amount of time when possible. It helps the circulation in your legs and helps reduce swelling.   Most information courtesy of the CDC.  Document Released: 02/06/2005 Document Revised: 05/01/2011 Document Reviewed: 10/21/2008  Exit[employer]® Patient Information ©2013 [employer].

## 2012-01-25 ENCOUNTER — Encounter: Payer: Medicaid Other | Admitting: Obstetrics and Gynecology

## 2012-01-25 ENCOUNTER — Other Ambulatory Visit: Payer: Medicaid Other

## 2012-01-26 LAB — US OB COMP + 14 WK

## 2012-02-01 ENCOUNTER — Telehealth: Payer: Self-pay

## 2012-02-01 MED ORDER — METRONIDAZOLE 500 MG PO TABS: 500.0000 mg | ORAL_TABLET | Freq: Two times a day (BID) | ORAL | Status: AC

## 2012-02-01 NOTE — Telephone Encounter (Signed)
Pc from pt questioning a prescription she thought she was to get for BV.  Nothing was prescribed at last visit with ND.  Spoke with ND and notified her that I saw nothing in the pts chart regarding BV.  She did however have a shift in flora suggestive of bv at new ob workup.   Flagyl 500mg  1 bid x 7 days no refills sent to pharmacy per ND.  msg left for pt that rx was sent in.  To call with further questions.  ld

## 2012-02-21 NOTE — L&D Delivery Note (Signed)
Delivery Note At 10:13 AM a viable female, "Ann Dunn", was delivered via Vaginal, Spontaneous Delivery (Compound Presentation: ; Occiput Anterior and left hand).  APGAR: 8, 9; weight .   Placenta status: Intact, Spontaneous.  Cord: 3 vessels with the following complications: Cord around leg.  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   Anesthesia: Epidural  Episiotomy: None Lacerations: Periurethral Suture Repair: 4.0 monocryl.  Red Robinson catheter placed in urethra prior to repair to localize the urethral opening during repair. Est. Blood Loss (mL): 250 cc  Mom to postpartum.  Baby to skin to skin.Marland Kitchen  Nigel Bridgeman 07/04/2012, 10:46 AM

## 2012-02-22 ENCOUNTER — Encounter: Payer: Self-pay | Admitting: Obstetrics and Gynecology

## 2012-02-22 ENCOUNTER — Ambulatory Visit (INDEPENDENT_AMBULATORY_CARE_PROVIDER_SITE_OTHER): Payer: Medicaid Other | Admitting: Obstetrics and Gynecology

## 2012-02-22 VITALS — BP 106/60 | Wt 149.0 lb

## 2012-02-22 DIAGNOSIS — Z331 Pregnant state, incidental: Secondary | ICD-10-CM

## 2012-02-22 MED ORDER — CITRANATAL HARMONY 29-1-265 MG PO CAPS: 1.0000 | ORAL_CAPSULE | Freq: Every day | ORAL | Status: DC

## 2012-02-22 NOTE — Patient Instructions (Signed)
Round Ligament Pain  The round ligament is made up of muscle and fibrous tissue. It is attached to the uterus near the fallopian tube. The round ligament is located on both sides of the uterus and helps support the position of the uterus. It usually begins in the second trimester of pregnancy when the uterus comes out of the pelvis. The pain can come and go until the baby is delivered. Round ligament pain is not a serious problem and does not cause harm to the baby.  CAUSE  During pregnancy the uterus grows the most from the second trimester to delivery. As it grows, it stretches and slightly twists the round ligaments. When the uterus leans from one side to the other, the round ligament on the opposite side pulls and stretches. This can cause pain.  SYMPTOMS   Pain can occur on one side or both sides. The pain is usually a short, sharp, and pinching-like. Sometimes it can be a dull, lingering and aching pain. The pain is located in the lower side of the abdomen or in the groin. The pain is internal and usually starts deep in the groin and moves up to the outside of the hip area. Pain can occur with:  · Sudden change in position like getting out of bed or a chair.  · Rolling over in bed.  · Coughing or sneezing.  · Walking too much.  · Any type of physical activity.  DIAGNOSIS   Your caregiver will make sure there are no serious problems causing the pain. When nothing serious is found, the symptoms usually indicate that the pain is from the round ligament.  TREATMENT   · Sit down and relax when the pain starts.  · Flex your knees up to your belly.  · Lay on your side with a pillow under your belly (abdomen) and another one between your legs.  · Sit in a hot bath for 15 to 20 minutes or until the pain goes away.  HOME CARE INSTRUCTIONS   · Only take over-the-counter or prescriptions medicines for pain, discomfort or fever as directed by your caregiver.  · Sit and stand slowly.  · Avoid long walks if it causes  pain.  · Stop or lessen your physical activities if it causes pain.  SEEK MEDICAL CARE IF:   · The pain does not go away with any of your treatment.  · You need stronger medication for the pain.  · You develop back pain that you did not have before with the side pain.  SEEK IMMEDIATE MEDICAL CARE IF:   · You develop a temperature of 102° F (38.9° C) or higher.  · You develop uterine contractions.  · You develop vaginal bleeding.  · You develop nausea, vomiting or diarrhea.  · You develop chills.  · You have pain when you urinate.  Document Released: 11/16/2007 Document Revised: 05/01/2011 Document Reviewed: 11/16/2007  Exit[employer]® Patient Information ©2013 [employer].

## 2012-02-22 NOTE — Progress Notes (Signed)
[redacted]w[redacted]d Discussed fetal  Movement and round ligament pain

## 2012-02-22 NOTE — Progress Notes (Signed)
Pt states baby has not moved as much in the past 3 days, c/o pain when trying to sleep.

## 2012-02-24 ENCOUNTER — Encounter (HOSPITAL_COMMUNITY): Payer: Self-pay | Admitting: Family

## 2012-02-24 ENCOUNTER — Inpatient Hospital Stay (HOSPITAL_COMMUNITY)
Admission: AD | Admit: 2012-02-24 | Discharge: 2012-02-24 | Disposition: A | Discharge status: Home / Self Care | Payer: BC Managed Care – PPO | Source: Ambulatory Visit | Attending: Obstetrics and Gynecology | Admitting: Obstetrics and Gynecology

## 2012-02-24 DIAGNOSIS — O200 Threatened abortion: Secondary | ICD-10-CM | POA: Insufficient documentation

## 2012-02-24 DIAGNOSIS — F1290 Cannabis use, unspecified, uncomplicated: Secondary | ICD-10-CM

## 2012-02-24 DIAGNOSIS — J45909 Unspecified asthma, uncomplicated: Secondary | ICD-10-CM

## 2012-02-24 DIAGNOSIS — N949 Unspecified condition associated with female genital organs and menstrual cycle: Secondary | ICD-10-CM

## 2012-02-24 DIAGNOSIS — Z331 Pregnant state, incidental: Secondary | ICD-10-CM

## 2012-02-24 LAB — URINALYSIS, ROUTINE W REFLEX MICROSCOPIC
Glucose, UA: NEGATIVE mg/dL
Leukocytes, UA: NEGATIVE
Protein, ur: NEGATIVE mg/dL
Urobilinogen, UA: 0.2 mg/dL (ref 0.0–1.0)

## 2012-02-24 NOTE — MAU Note (Signed)
Pt reports having sharp contraction like pain q 5-7 apart for the past 2 hrs. Denies any vag bleeding or discharge.

## 2012-02-24 NOTE — Progress Notes (Signed)
Patient presents to MAU with c/o braxton hicks contractions between 5-7 minutes apart since 1330 today at work. She drank fluids and was resting for one hour and the contractions continued so she came in.  Denies vaginal bleeding or discharge; reports good fetal movement.       Less crampy now drinks 3 water bottles daily O BP 116/65  Pulse 81  Temp 98 F (36.7 C) (Oral)  Resp 18  Ht 4\' 11"  (1.499 m)  Wt 153 lb 12.8 oz (69.763 kg)  BMI 31.06 kg/m2  LMP 09/24/2011      fhts +      abd soft FH at [facility]      Vag LTC  UA neg A [redacted]w[redacted]d P discussed water, frequent voids, s/s bleeding, cramping to report, f/o as scheduled at office. Lavera Guise, [name]

## 2012-02-24 NOTE — MAU Note (Signed)
Patient presents to MAU with c/o braxton hicks contractions between 5-7 minutes apart since 1330 today at work. She drank fluids and was resting for one hour and the contractions continued so she came in.  Denies vaginal bleeding or discharge; reports good fetal movement.

## 2012-03-21 ENCOUNTER — Encounter: Payer: Self-pay | Admitting: Obstetrics and Gynecology

## 2012-03-21 ENCOUNTER — Ambulatory Visit: Payer: Medicaid Other | Admitting: Obstetrics and Gynecology

## 2012-03-21 VITALS — BP 100/62 | Wt 157.0 lb

## 2012-03-21 DIAGNOSIS — Z3490 Encounter for supervision of normal pregnancy, unspecified, unspecified trimester: Secondary | ICD-10-CM

## 2012-03-21 DIAGNOSIS — F17200 Nicotine dependence, unspecified, uncomplicated: Secondary | ICD-10-CM

## 2012-03-21 DIAGNOSIS — J45909 Unspecified asthma, uncomplicated: Secondary | ICD-10-CM

## 2012-03-21 NOTE — Progress Notes (Signed)
[redacted]w[redacted]d Pt stated no issues today.

## 2012-03-27 ENCOUNTER — Inpatient Hospital Stay (HOSPITAL_COMMUNITY)
Admission: AD | Admit: 2012-03-27 | Discharge: 2012-03-28 | Disposition: A | Discharge status: Home / Self Care | Payer: Medicaid Other | Source: Ambulatory Visit | Attending: Obstetrics and Gynecology | Admitting: Obstetrics and Gynecology

## 2012-03-27 ENCOUNTER — Encounter (HOSPITAL_COMMUNITY): Payer: Self-pay | Admitting: *Deleted

## 2012-03-27 ENCOUNTER — Inpatient Hospital Stay (HOSPITAL_COMMUNITY): Payer: Medicaid Other

## 2012-03-27 DIAGNOSIS — Y92410 Unspecified street and highway as the place of occurrence of the external cause: Secondary | ICD-10-CM | POA: Insufficient documentation

## 2012-03-27 DIAGNOSIS — J45909 Unspecified asthma, uncomplicated: Secondary | ICD-10-CM

## 2012-03-27 DIAGNOSIS — F1290 Cannabis use, unspecified, uncomplicated: Secondary | ICD-10-CM

## 2012-03-27 DIAGNOSIS — O99891 Other specified diseases and conditions complicating pregnancy: Secondary | ICD-10-CM | POA: Insufficient documentation

## 2012-03-27 DIAGNOSIS — R109 Unspecified abdominal pain: Secondary | ICD-10-CM | POA: Insufficient documentation

## 2012-03-27 LAB — CBC
Platelets: 213 10*3/uL (ref 150–400)
RDW: 13.8 % (ref 11.5–15.5)
WBC: 15.9 10*3/uL — ABNORMAL HIGH (ref 4.0–10.5)

## 2012-03-27 MED ORDER — IBUPROFEN 600 MG PO TABS
600.0000 mg | ORAL_TABLET | Freq: Once | ORAL | Status: AC
  Administered 2012-03-27: 600 mg via ORAL
  Filled 2012-03-27: qty 1

## 2012-03-27 NOTE — MAU Note (Signed)
Pt uncomfortable in bed. Repositions self in bed and fetal heart monitor keeps moving.
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
## 2012-04-18 ENCOUNTER — Encounter: Payer: Self-pay | Admitting: Obstetrics and Gynecology

## 2012-04-18 ENCOUNTER — Ambulatory Visit: Payer: Medicaid Other | Admitting: Obstetrics and Gynecology

## 2012-04-18 ENCOUNTER — Other Ambulatory Visit: Payer: Medicaid Other

## 2012-04-18 VITALS — BP 120/60 | Wt 167.0 lb

## 2012-04-18 DIAGNOSIS — Z331 Pregnant state, incidental: Secondary | ICD-10-CM

## 2012-04-18 LAB — HEMOGLOBIN: Hemoglobin: 10.3 g/dL — ABNORMAL LOW (ref 12.0–15.0)

## 2012-04-18 NOTE — Progress Notes (Signed)
Pt c/o back pain

## 2012-04-18 NOTE — Patient Instructions (Signed)
Back Pain in Pregnancy Back pain during pregnancy is common. It happens in about half of all pregnancies. It is important for you and your baby that you remain active during your pregnancy.If you feel that back pain is not allowing you to remain active or sleep well, it is time to see your caregiver. Back pain may be caused by several factors related to changes during your pregnancy.Fortunately, unless you had trouble with your back before your pregnancy, the pain is likely to get better after you deliver. Low back pain usually occurs between the fifth and seventh months of pregnancy. It can, however, happen in the first couple months. Factors that increase the risk of back problems include:   Previous back problems.  Injury to your back.  Having twins or multiple births.  A chronic cough.  Stress.  Job-related repetitive motions.  Muscle or spinal disease in the back.  Family history of back problems, ruptured (herniated) discs, or osteoporosis.  Depression, anxiety, and panic attacks. CAUSES   When you are pregnant, your body produces a hormone called relaxin. This hormonemakes the ligaments connecting the low back and pubic bones more flexible. This flexibility allows the baby to be delivered more easily. When your ligaments are loose, your muscles need to work harder to support your back. Soreness in your back can come from tired muscles. Soreness can also come from back tissues that are irritated since they are receiving less support.  As the baby grows, it puts pressure on the nerves and blood vessels in your pelvis. This can cause back pain.  As the baby grows and gets heavier during pregnancy, the uterus pushes the stomach muscles forward and changes your center of gravity. This makes your back muscles work harder to maintain good posture. SYMPTOMS  Lumbar pain during pregnancy Lumbar pain during pregnancy usually occurs at or above the waist in the center of the back. There  may be pain and numbness that radiates into your leg or foot. This is similar to low back pain experienced by non-pregnant women. It usually increases with sitting for long periods of time, standing, or repetitive lifting. Tenderness may also be present in the muscles along your upper back. Posterior pelvic pain during pregnancy Pain in the back of the pelvis is more common than lumbar pain in pregnancy. It is a deep pain felt in your side at the waistline, or across the tailbone (sacrum), or in both places. You may have pain on one or both sides. This pain can also go into the buttocks and backs of the upper thighs. Pubic and groin pain may also be present. The pain does not quickly resolve with rest, and morning stiffness may also be present. Pelvic pain during pregnancy can be brought on by most activities. A high level of fitness before and during pregnancy may or may not prevent this problem. Labor pain is usually 1 to 2 minutes apart, lasts for about 1 minute, and involves a bearing down feeling or pressure in your pelvis. However, if you are at term with the pregnancy, constant low back pain can be the beginning of early labor, and you should be aware of this. DIAGNOSIS  X-rays of the back should not be done during the first 12 to 14 weeks of the pregnancy and only when absolutely necessary during the rest of the pregnancy. MRIs do not give off radiation and are safe during pregnancy. MRIs also should only be done when absolutely necessary. HOME CARE INSTRUCTIONS  Exercise   as directed by your caregiver. Exercise is the most effective way to prevent or manage back pain. If you have a back problem, it is especially important to avoid sports that require sudden body movements. Swimming and walking are great activities.  Do not stand in one place for long periods of time.  Do not wear high heels.  Sit in chairs with good posture. Use a pillow on your lower back if necessary. Make sure your head  rests over your shoulders and is not hanging forward.  Try sleeping on your side, preferably the left side, with a pillow or two between your legs. If you are sore after a night's rest, your bedmay betoo soft.Try placing a board between your mattress and box spring.  Listen to your body when lifting.If you are experiencing pain, ask for help or try bending yourknees more so you can use your leg muscles rather than your back muscles. Squat down when picking up something from the floor. Do not bend over.  Eat a healthy diet. Try to gain weight within your caregiver's recommendations.  Use heat or cold packs 3 to 4 times a day for 15 minutes to help with the pain.  Only take over-the-counter or prescription medicines for pain, discomfort, or fever as directed by your caregiver. Sudden (acute) back pain  Use bed rest for only the most extreme, acute episodes of back pain. Prolonged bed rest over 48 hours will aggravate your condition.  Ice is very effective for acute conditions.  Put ice in a plastic bag.  Place a towel between your skin and the bag.  Leave the ice on for 10 to 20 minutes every 2 hours, or as needed.  Using heat packs for 30 minutes prior to activities is also helpful. Continued back pain See your caregiver if you have continued problems. Your caregiver can help or refer you for appropriate physical therapy. With conditioning, most back problems can be avoided. Sometimes, a more serious issue may be the cause of back pain. You should be seen right away if new problems seem to be developing. Your caregiver may recommend:  A maternity girdle.  An elastic sling.  A back brace.  A massage therapist or acupuncture. SEEK MEDICAL CARE IF:   You are not able to do most of your daily activities, even when taking the pain medicine you were given.  You need a referral to a physical therapist or chiropractor.  You want to try acupuncture. SEEK IMMEDIATE MEDICAL CARE  IF:  You develop numbness, tingling, weakness, or problems with the use of your arms or legs.  You develop severe back pain that is no longer relieved with medicines.  You have a sudden change in bowel or bladder control.  You have increasing pain in other areas of the body.  You develop shortness of breath, dizziness, or fainting.  You develop nausea, vomiting, or sweating.  You have back pain which is similar to labor pains.  You have back pain along with your water breaking or vaginal bleeding.  You have back pain or numbness that travels down your leg.  Your back pain developed after you fell.  You develop pain on one side of your back. You may have a kidney stone.  You see blood in your urine. You may have a bladder infection or kidney stone.  You have back pain with blisters. You may have shingles. Back pain is fairly common during pregnancy but should not be accepted as just part of   the process. Back pain should always be treated as soon as possible. This will make your pregnancy as pleasant as possible. Document Released: 05/17/2005 Document Revised: 05/01/2011 Document Reviewed: 06/28/2010 [facility] Patient Information 2013 [place], [place]. Fetal Movement Counts Patient Name: __________________________________________________ Patient Due Date: ____________________ Melody Haver counts is highly recommended in high risk pregnancies, but it is a good idea for every pregnant woman to do. Start counting fetal movements at 28 weeks of the pregnancy. Fetal movements increase after eating a full meal or eating or drinking something sweet (the blood sugar is higher). It is also important to drink plenty of fluids (well hydrated) before doing the count. Lie on your left side because it helps with the circulation or you can sit in a comfortable chair with your arms over your belly (abdomen) with no distractions around you. DOING THE COUNT  Try to do the count the same time of day each time  you do it.  Mark the day and time, then see how long it takes for you to feel 10 movements (kicks, flutters, swishes, rolls). You should have at least 10 movements within 2 hours. You will most likely feel 10 movements in much less than 2 hours. If you do not, wait an hour and count again. After a couple of days you will see a pattern.  What you are looking for is a change in the pattern or not enough counts in 2 hours. Is it taking longer in time to reach 10 movements? SEEK MEDICAL CARE IF:  You feel less than 10 counts in 2 hours. Tried twice.  No movement in one hour.  The pattern is changing or taking longer each day to reach 10 counts in 2 hours.  You feel the baby is not moving as it usually does. Date: ____________ Movements: ____________ Start time: ____________ Doreatha Martin time: ____________  Date: ____________ Movements: ____________ Start time: ____________ Doreatha Martin time: ____________ Date: ____________ Movements: ____________ Start time: ____________ Doreatha Martin time: ____________ Date: ____________ Movements: ____________ Start time: ____________ Doreatha Martin time: ____________ Date: ____________ Movements: ____________ Start time: ____________ Doreatha Martin time: ____________ Date: ____________ Movements: ____________ Start time: ____________ Doreatha Martin time: ____________ Date: ____________ Movements: ____________ Start time: ____________ Doreatha Martin time: ____________ Date: ____________ Movements: ____________ Start time: ____________ Doreatha Martin time: ____________  Date: ____________ Movements: ____________ Start time: ____________ Doreatha Martin time: ____________ Date: ____________ Movements: ____________ Start time: ____________ Doreatha Martin time: ____________ Date: ____________ Movements: ____________ Start time: ____________ Doreatha Martin time: ____________ Date: ____________ Movements: ____________ Start time: ____________ Doreatha Martin time: ____________ Date: ____________ Movements: ____________ Start time: ____________ Doreatha Martin time:  ____________ Date: ____________ Movements: ____________ Start time: ____________ Doreatha Martin time: ____________ Date: ____________ Movements: ____________ Start time: ____________ Doreatha Martin time: ____________  Date: ____________ Movements: ____________ Start time: ____________ Doreatha Martin time: ____________ Date: ____________ Movements: ____________ Start time: ____________ Doreatha Martin time: ____________ Date: ____________ Movements: ____________ Start time: ____________ Doreatha Martin time: ____________ Date: ____________ Movements: ____________ Start time: ____________ Doreatha Martin time: ____________ Date: ____________ Movements: ____________ Start time: ____________ Doreatha Martin time: ____________ Date: ____________ Movements: ____________ Start time: ____________ Doreatha Martin time: ____________ Date: ____________ Movements: ____________ Start time: ____________ Doreatha Martin time: ____________  Date: ____________ Movements: ____________ Start time: ____________ Doreatha Martin time: ____________ Date: ____________ Movements: ____________ Start time: ____________ Doreatha Martin time: ____________ Date: ____________ Movements: ____________ Start time: ____________ Doreatha Martin time: ____________ Date: ____________ Movements: ____________ Start time: ____________ Doreatha Martin time: ____________ Date: ____________ Movements: ____________ Start time: ____________ Doreatha Martin time: ____________ Date: ____________ Movements: ____________ Start time: ____________ Doreatha Martin time: ____________ Date: ____________ Movements: ____________ Start time: ____________ Doreatha Martin  time: ____________  Date: ____________ Movements: ____________ Start time: ____________ Doreatha Martin time: ____________ Date: ____________ Movements: ____________ Start time: ____________ Doreatha Martin time: ____________ Date: ____________ Movements: ____________ Start time: ____________ Doreatha Martin time: ____________ Date: ____________ Movements: ____________ Start time: ____________ Doreatha Martin time: ____________ Date: ____________ Movements:  ____________ Start time: ____________ Doreatha Martin time: ____________ Date: ____________ Movements: ____________ Start time: ____________ Doreatha Martin time: ____________ Date: ____________ Movements: ____________ Start time: ____________ Doreatha Martin time: ____________  Date: ____________ Movements: ____________ Start time: ____________ Doreatha Martin time: ____________ Date: ____________ Movements: ____________ Start time: ____________ Doreatha Martin time: ____________ Date: ____________ Movements: ____________ Start time: ____________ Doreatha Martin time: ____________ Date: ____________ Movements: ____________ Start time: ____________ Doreatha Martin time: ____________ Date: ____________ Movements: ____________ Start time: ____________ Doreatha Martin time: ____________ Date: ____________ Movements: ____________ Start time: ____________ Doreatha Martin time: ____________ Date: ____________ Movements: ____________ Start time: ____________ Doreatha Martin time: ____________  Date: ____________ Movements: ____________ Start time: ____________ Doreatha Martin time: ____________ Date: ____________ Movements: ____________ Start time: ____________ Doreatha Martin time: ____________ Date: ____________ Movements: ____________ Start time: ____________ Doreatha Martin time: ____________ Date: ____________ Movements: ____________ Start time: ____________ Doreatha Martin time: ____________ Date: ____________ Movements: ____________ Start time: ____________ Doreatha Martin time: ____________ Date: ____________ Movements: ____________ Start time: ____________ Doreatha Martin time: ____________ Date: ____________ Movements: ____________ Start time: ____________ Doreatha Martin time: ____________  Date: ____________ Movements: ____________ Start time: ____________ Doreatha Martin time: ____________ Date: ____________ Movements: ____________ Start time: ____________ Doreatha Martin time: ____________ Date: ____________ Movements: ____________ Start time: ____________ Doreatha Martin time: ____________ Date: ____________ Movements: ____________ Start time: ____________ Doreatha Martin  time: ____________ Date: ____________ Movements: ____________ Start time: ____________ Doreatha Martin time: ____________ Date: ____________ Movements: ____________ Start time: ____________ Doreatha Martin time: ____________ Document Released: 03/08/2006 Document Revised: 05/01/2011 Document Reviewed: 09/08/2008 [employer] Patient Information 2013 [employer].

## 2012-04-18 NOTE — Progress Notes (Signed)
[redacted]w[redacted]d A/P Glucola, hemoglobin and RPR today Fetal kick counts reviewed All patients questions answered Return in two weeks Continue Prenatal vitamins Blood type o pos Pt given support measure for back pain

## 2012-04-19 LAB — RPR

## 2012-04-19 LAB — GLUCOSE TOLERANCE, 1 HOUR (50G) W/O FASTING: Glucose, 1 Hour GTT: 92 mg/dL (ref 70–140)

## 2012-05-03 ENCOUNTER — Ambulatory Visit: Payer: Medicaid Other | Admitting: Family Medicine

## 2012-05-03 ENCOUNTER — Encounter: Payer: Self-pay | Admitting: Obstetrics and Gynecology

## 2012-05-03 VITALS — BP 110/64 | Wt 171.0 lb

## 2012-05-03 DIAGNOSIS — Z331 Pregnant state, incidental: Secondary | ICD-10-CM

## 2012-05-03 NOTE — Progress Notes (Signed)
[redacted]w[redacted]d Doing well, good fetal movement. Reviewed glucola.  No back pain today. ROB in 2 weeks. L.Carter, FNP-BC

## 2012-05-03 NOTE — Progress Notes (Signed)
[redacted]w[redacted]d No complaints today. Hag glucola at last visit.

## 2012-06-03 ENCOUNTER — Encounter: Payer: Self-pay | Admitting: Obstetrics and Gynecology

## 2012-06-28 ENCOUNTER — Inpatient Hospital Stay (HOSPITAL_COMMUNITY)
Admission: AD | Admit: 2012-06-28 | Discharge: 2012-06-29 | Disposition: A | Discharge status: Home / Self Care | Payer: Medicaid Other | Source: Ambulatory Visit | Attending: Obstetrics and Gynecology | Admitting: Obstetrics and Gynecology

## 2012-06-28 ENCOUNTER — Encounter (HOSPITAL_COMMUNITY): Payer: Self-pay

## 2012-06-28 DIAGNOSIS — O99891 Other specified diseases and conditions complicating pregnancy: Secondary | ICD-10-CM | POA: Insufficient documentation

## 2012-06-28 DIAGNOSIS — O479 False labor, unspecified: Secondary | ICD-10-CM | POA: Insufficient documentation

## 2012-06-28 DIAGNOSIS — O471 False labor at or after 37 completed weeks of gestation: Secondary | ICD-10-CM

## 2012-06-28 NOTE — MAU Provider Note (Signed)
History   Ann Dunn is a 21y.o. BF at [redacted]w[redacted]d who presents for r/o ROM w/ CC of LOF since 2045.  Did pad count and when stood up, had some leakage.  No gushes, but continues to have leakage that is coming out on a pad.  Nml FM.  No VB.  Only other c/o is some back pain. Unsure if having ctxs.  No UTI or PIH s/s.    .. Patient Active Problem List   Diagnosis Date Noted  . Normal pregnancy 03/21/2012  . 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 H/O spontaneous abortion, currently pregnant 11/27/2011  . H/O induced abortion 11/27/2011  . 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 Asthma 11/15/2011    CSN: [id number]  Arrival date and time: 06/28/12 2238   None     Chief Complaint  Patient presents with  . Rupture of Membranes  . Contractions   HPI  OB History   Grav Para Term Preterm Abortions TAB SAB Ect Mult Living   3    2 1 1    0      Past Medical History  Diagnosis Date  . H/O spontaneous abortion, currently pregnant 11/27/2011    SAB in Feb 2013  . H/O induced abortion 11/27/2011  . Headache   . Infection 2010;2011    Yeast x 2  . Infection     BV x 1  . Asthma     Triggered by grass and pollen;inhaler prn  . Vertigo     Not officially dx'd  . Bacterial vaginosis   . PCOS (polycystic ovarian syndrome)     Past Surgical History  Procedure Laterality Date  . Arm wound repair / closure    . Induced abortion      Family History  Problem Relation Age of Onset  . Stroke Maternal Grandmother   . Cancer Maternal Grandmother     History  Substance Use Topics  . Smoking status: Never Smoker   . Smokeless tobacco: Never Used     Comment: marijuana in the past  . Alcohol Use: 0.5 oz/week    1 drink(s) per week     Comment: dc'd +UPT    Allergies: No Known Allergies  Prescriptions prior to admission  Medication Sig Dispense Refill  . acetaminophen (TYLENOL) 500 MG tablet Take 500 mg by mouth every 6 (six) hours as needed. pain       . fluticasone (FLOVENT HFA) 110 MCG/ACT inhaler Inhale 1 puff into the lungs 2 (two) times daily as needed. Wheezing/shortness of breath      . Prenatal Vit-Fe Fumarate-FA (PRENATAL MULTIVITAMIN) TABS Take 1 tablet by mouth daily.        ROS--see HPI above Physical Exam   Blood pressure 147/78, pulse 100, temperature 98.2 F (36.8 C), resp. rate 20, height 4\' 11"  (1.499 m), weight 189 lb 6.4 oz (85.911 kg), last menstrual period 09/24/2011, SpO2 100.00%.  Physical Exam  Constitutional: She is oriented to person, place, and time. She appears well-developed and well-nourished. No distress.  HENT:  Head: Normocephalic and atraumatic.  Eyes: Pupils are equal, round, and reactive to light.  Cardiovascular: Normal rate.   Respiratory: Effort normal.  GI: Soft.  gravid  Genitourinary:  Cx: 1/40-50/-2, medium firmness, posterior to mid. Neg pooling; and no LOF from os when asked to forcefully cough  Neurological: She is alert and oriented to person, place, and time.  Skin: Skin is warm and dry.  Psychiatric: She  has a normal mood and affect. Her behavior is normal. Judgment and thought content normal.   .. Results for orders placed during the hospital encounter of 06/28/12 (from the past 24 hour(s))  AMNISURE RUPTURE OF MEMBRANE (ROM)     Status: None   Collection Time    06/28/12 11:16 PM      Result Value Range   Amnisure ROM NEGATIVE     MAU Course  Procedures 1. amnisure 2. Fern test  Assessment and Plan  1. [redacted]w[redacted]d 2. No s/s of ROM or labor 3. Likely Urinary incontinence 4. Nonreactive NST, but had 8/8 BPP yesterday and 1 15x15 accel, and several 10x10, so reassuring  1. D/c'd home after extended NST; given 50mg  po Benadryl at time of d/c 2. F/u next week at office as scheduled, or f/u prn 3. Labor and leakage precautions rev'd and FKC  Riki Berninger H 06/28/2012, 11:28 PM

## 2012-06-28 NOTE — MAU Note (Signed)
Leaking fld since 2045. Irreg contractions started after water started leaking.

## 2012-06-29 MED ORDER — DIPHENHYDRAMINE HCL 25 MG PO CAPS
50.0000 mg | ORAL_CAPSULE | Freq: Once | ORAL | Status: AC
  Administered 2012-06-29: 50 mg via ORAL
  Filled 2012-06-29: qty 2

## 2012-07-02 ENCOUNTER — Telehealth (HOSPITAL_COMMUNITY): Payer: Self-pay | Admitting: *Deleted

## 2012-07-02 ENCOUNTER — Encounter (HOSPITAL_COMMUNITY): Payer: Self-pay | Admitting: *Deleted

## 2012-07-02 NOTE — Telephone Encounter (Signed)
Preadmission screen  

## 2012-07-03 ENCOUNTER — Encounter (HOSPITAL_COMMUNITY): Payer: Self-pay | Admitting: *Deleted

## 2012-07-03 ENCOUNTER — Encounter (HOSPITAL_COMMUNITY): Payer: Self-pay | Admitting: Anesthesiology

## 2012-07-03 ENCOUNTER — Inpatient Hospital Stay (HOSPITAL_COMMUNITY)
Admission: AD | Admit: 2012-07-03 | Discharge: 2012-07-07 | DRG: 775 | Disposition: A | Discharge status: Home / Self Care | Payer: Medicaid Other | Source: Ambulatory Visit | Attending: Obstetrics and Gynecology | Admitting: Obstetrics and Gynecology

## 2012-07-03 ENCOUNTER — Inpatient Hospital Stay (HOSPITAL_COMMUNITY)
Admission: AD | Admit: 2012-07-03 | Discharge: 2012-07-03 | Disposition: A | Discharge status: Home / Self Care | Payer: BC Managed Care – PPO | Source: Ambulatory Visit | Attending: Obstetrics and Gynecology | Admitting: Obstetrics and Gynecology

## 2012-07-03 ENCOUNTER — Inpatient Hospital Stay (HOSPITAL_COMMUNITY): Payer: Medicaid Other | Admitting: Anesthesiology

## 2012-07-03 DIAGNOSIS — O9903 Anemia complicating the puerperium: Secondary | ICD-10-CM | POA: Diagnosis not present

## 2012-07-03 DIAGNOSIS — O328XX0 Maternal care for other malpresentation of fetus, not applicable or unspecified: Secondary | ICD-10-CM | POA: Diagnosis present

## 2012-07-03 DIAGNOSIS — IMO0001 Reserved for inherently not codable concepts without codable children: Secondary | ICD-10-CM

## 2012-07-03 DIAGNOSIS — O479 False labor, unspecified: Secondary | ICD-10-CM | POA: Insufficient documentation

## 2012-07-03 DIAGNOSIS — J45909 Unspecified asthma, uncomplicated: Secondary | ICD-10-CM

## 2012-07-03 DIAGNOSIS — F1290 Cannabis use, unspecified, uncomplicated: Secondary | ICD-10-CM

## 2012-07-03 DIAGNOSIS — D649 Anemia, unspecified: Secondary | ICD-10-CM | POA: Diagnosis not present

## 2012-07-03 LAB — LACTATE DEHYDROGENASE: LDH: 256 U/L — ABNORMAL HIGH (ref 94–250)

## 2012-07-03 LAB — COMPREHENSIVE METABOLIC PANEL
Alkaline Phosphatase: 157 U/L — ABNORMAL HIGH (ref 39–117)
BUN: 7 mg/dL (ref 6–23)
GFR calc Af Amer: >90 mL/min (ref >90)
GFR calc non Af Amer: >90 mL/min (ref >90)
Glucose, Bld: 115 mg/dL — ABNORMAL HIGH (ref 70–99)
Potassium: 3.8 mEq/L (ref 3.5–5.1)
Total Protein: 6.4 g/dL (ref 6.0–8.3)

## 2012-07-03 LAB — URINALYSIS, ROUTINE W REFLEX MICROSCOPIC
Bilirubin Urine: NEGATIVE
Hgb urine dipstick: NEGATIVE
Ketones, ur: NEGATIVE mg/dL
Protein, ur: NEGATIVE mg/dL
Urobilinogen, UA: 0.2 mg/dL (ref 0.0–1.0)

## 2012-07-03 LAB — CBC
HCT: 33.8 % — ABNORMAL LOW (ref 36.0–46.0)
MCHC: 32.8 g/dL (ref 30.0–36.0)
MCV: 81.6 fL (ref 78.0–100.0)
RDW: 14.8 % (ref 11.5–15.5)

## 2012-07-03 MED ORDER — ACETAMINOPHEN 325 MG PO TABS: 650.0000 mg | ORAL_TABLET | ORAL | Status: DC | PRN

## 2012-07-03 MED ORDER — OXYCODONE-ACETAMINOPHEN 5-325 MG PO TABS: 1.0000 | ORAL_TABLET | ORAL | Status: DC | PRN

## 2012-07-03 MED ORDER — LACTATED RINGERS IV SOLN: 500.0000 mL | INTRAVENOUS | Status: DC | PRN

## 2012-07-03 MED ORDER — FENTANYL 2.5 MCG/ML BUPIVACAINE 1/10 % EPIDURAL INFUSION (WH - ANES)
14.0000 mL/h | INTRAMUSCULAR | Status: DC | PRN
  Administered 2012-07-03: 12 mL/h via EPIDURAL
  Administered 2012-07-04: 14 mL/h via EPIDURAL
  Filled 2012-07-03 (×2): qty 125

## 2012-07-03 MED ORDER — LIDOCAINE HCL (PF) 1 % IJ SOLN
INTRAMUSCULAR | Status: DC | PRN
  Administered 2012-07-03: 4 mL
  Administered 2012-07-03: 2 mL
  Administered 2012-07-03 (×2): 4 mL

## 2012-07-03 MED ORDER — PHENYLEPHRINE 40 MCG/ML (10ML) SYRINGE FOR IV PUSH (FOR BLOOD PRESSURE SUPPORT)
80.0000 ug | PREFILLED_SYRINGE | INTRAVENOUS | Status: DC | PRN
  Filled 2012-07-03: qty 2
  Filled 2012-07-03: qty 5

## 2012-07-03 MED ORDER — IBUPROFEN 600 MG PO TABS: 600.0000 mg | ORAL_TABLET | Freq: Four times a day (QID) | ORAL | Status: DC | PRN

## 2012-07-03 MED ORDER — DIPHENHYDRAMINE HCL 50 MG/ML IJ SOLN
12.5000 mg | INTRAMUSCULAR | Status: DC | PRN
  Administered 2012-07-04: 12.5 mg via INTRAVENOUS
  Filled 2012-07-03: qty 1

## 2012-07-03 MED ORDER — FENTANYL CITRATE 0.05 MG/ML IJ SOLN: 100.0000 ug | INTRAMUSCULAR | Status: DC | PRN

## 2012-07-03 MED ORDER — PHENYLEPHRINE 40 MCG/ML (10ML) SYRINGE FOR IV PUSH (FOR BLOOD PRESSURE SUPPORT)
80.0000 ug | PREFILLED_SYRINGE | INTRAVENOUS | Status: DC | PRN
  Filled 2012-07-03: qty 2

## 2012-07-03 MED ORDER — LACTATED RINGERS IV SOLN
500.0000 mL | Freq: Once | INTRAVENOUS | Status: AC
  Administered 2012-07-03: 500 mL via INTRAVENOUS

## 2012-07-03 MED ORDER — SODIUM CHLORIDE 0.9 % IJ SOLN: 3.0000 mL | Freq: Two times a day (BID) | INTRAMUSCULAR | Status: DC

## 2012-07-03 MED ORDER — SODIUM CHLORIDE 0.9 % IJ SOLN: 3.0000 mL | INTRAMUSCULAR | Status: DC | PRN

## 2012-07-03 MED ORDER — OXYTOCIN 40 UNITS IN LACTATED RINGERS INFUSION - SIMPLE MED
62.5000 mL/h | INTRAVENOUS | Status: DC
  Filled 2012-07-03: qty 1000

## 2012-07-03 MED ORDER — CITRIC ACID-SODIUM CITRATE 334-500 MG/5ML PO SOLN: 30.0000 mL | ORAL | Status: DC | PRN

## 2012-07-03 MED ORDER — EPHEDRINE 5 MG/ML INJ
10.0000 mg | INTRAVENOUS | Status: DC | PRN
  Filled 2012-07-03: qty 2
  Filled 2012-07-03: qty 4

## 2012-07-03 MED ORDER — LIDOCAINE HCL (PF) 1 % IJ SOLN
30.0000 mL | INTRAMUSCULAR | Status: AC | PRN
  Administered 2012-07-04: 30 mL via SUBCUTANEOUS
  Filled 2012-07-03 (×2): qty 30

## 2012-07-03 MED ORDER — SODIUM CHLORIDE 0.9 % IV SOLN: 250.0000 mL | INTRAVENOUS | Status: DC | PRN

## 2012-07-03 MED ORDER — EPHEDRINE 5 MG/ML INJ
10.0000 mg | INTRAVENOUS | Status: DC | PRN
  Filled 2012-07-03: qty 2

## 2012-07-03 MED ORDER — LACTATED RINGERS IV SOLN
INTRAVENOUS | Status: DC
  Administered 2012-07-03 – 2012-07-04 (×2): via INTRAVENOUS

## 2012-07-03 MED ORDER — ONDANSETRON HCL 4 MG/2ML IJ SOLN
4.0000 mg | Freq: Four times a day (QID) | INTRAMUSCULAR | Status: DC | PRN
  Administered 2012-07-04: 4 mg via INTRAVENOUS
  Filled 2012-07-03: qty 2

## 2012-07-03 MED ORDER — OXYTOCIN BOLUS FROM INFUSION
500.0000 mL | INTRAVENOUS | Status: DC
  Administered 2012-07-04: 500 mL via INTRAVENOUS

## 2012-07-03 NOTE — H&P (Signed)
Ann Dunn is a 22 y.o.single Black female presenting for 2nd labor check today.  D/c'd home around lunch for irregular ctxs and cx 3cm then.  Has had bloody show, but no gushes or LOF.  GFM.  No UTI or PIH s/s.  Reports Loose stools today.  S.o., Ann Dunn, is involved and supportive. Pt came to hospital alone initially until knowing if cervix had progressed and would be admitted.  Prenatal Course: Pt entered care at 6 weeks in MAU.  Had a dating u/s at [redacted]w[redacted]d at [facility] and [facility] c/w LMP.  Pregnancy has been overall uncomplicated.  Polydactly noted on anatomy u/s at [redacted]w[redacted]d on Lt hand.  Pt declined aneuploidy screening.  Normal 1hr gtt=92.  Struggled w/ joint pain in third trimester.  Decreased fetal movement last few weeks before EDC, and had BPP at office at 38 weeks and [redacted]w[redacted]d and both u/s Fetus received 8/8 scores.  GBS neg 05/31/12.  OB Hx: G1=TAB '08 at 9weeks G2=SAB 2/'13 at 7 weeks; no complications G3=current  Maternal Medical History:  Reason for admission: Contractions.   Contractions: Onset was 13-24 hours ago.   Frequency: regular.   Perceived severity is moderate.    Fetal activity: Perceived fetal activity is normal.   Last perceived fetal movement was within the past hour.      OB History   Grav Para Term Preterm Abortions TAB SAB Ect Mult Living   3    2 1 1    0     Past Medical History  Diagnosis Date  . H/O spontaneous abortion, currently pregnant 11/27/2011    SAB in Feb 2013  . H/O induced abortion 11/27/2011  . Headache   . Infection 2010;2011    Yeast x 2  . Infection     BV x 1  . Asthma     Triggered by grass and pollen;inhaler prn  . Vertigo     Not officially dx'd  . Bacterial vaginosis   . PCOS (polycystic ovarian syndrome)    Past Surgical History  Procedure Laterality Date  . Arm wound repair / closure    . Induced abortion     Family History: family history includes Cancer in her maternal grandmother and Stroke in her maternal  grandmother. Social History:  reports that she has never smoked. She has never used smokeless tobacco. She reports that she drinks about 0.5 ounces of alcohol per week. She reports that she uses illicit drugs (Marijuana) about 14 times per week.   Prenatal Transfer Tool  Maternal Diabetes: No Genetic Screening: Declined Maternal Ultrasounds/Referrals: Abnormal:  Findings:   Other: Fetal Ultrasounds or other Referrals:  None Maternal Substance Abuse:  Yes:  Type: Marijuana Significant Maternal Medications:  None Significant Maternal Lab Results:  Lab values include: Group B Strep negative Other Comments:  polydactyl noted on Lt hand on u/s; pt denied MJ use in pregnancy  Review of Systems  Constitutional: Negative.   HENT:       Frequent headaches, but normal for her she states  Eyes: Negative.   Respiratory: Negative.   Cardiovascular: Negative.   Genitourinary: Negative.   Skin: Negative.   Neurological: Positive for headaches.    Dilation: 5 Effacement (%): 90 Station: -2 Exam by:: Diamonds Lippard, [name] Blood pressure 151/78, pulse 84, temperature 97.9 F (36.6 C), temperature source Oral, resp. rate 18, last menstrual period 09/24/2011. Maternal Exam:  Uterine Assessment: Contraction strength is moderate.  Contraction frequency is irregular.   Abdomen: Patient reports no  abdominal tenderness. Fetal presentation: vertex  Introitus: Normal vulva. Ferning test: not done.  Nitrazine test: not done.  Pelvis: adequate for delivery.   Cervix: Cervix evaluated by digital exam.     Fetal Exam Fetal Monitor Review: Mode: ultrasound.   Baseline rate: 135.  Variability: moderate (6-25 bpm).   Pattern: no decelerations and accelerations present.    Fetal State Assessment: Category I - tracings are normal.     Physical Exam  Constitutional: She is oriented to person, place, and time. She appears well-developed and well-nourished. She appears distressed.  Grimace and labored  breathing w/ ctxs  HENT:  Head: Normocephalic and atraumatic.  Eyes: Pupils are equal, round, and reactive to light.  Cardiovascular: Normal rate.   Respiratory: Effort normal.  GI: Soft.  gravid  Genitourinary:  BBOW; bloody show noted  Musculoskeletal: She exhibits edema.  1-2+ BLE pitting edema  Neurological: She is alert and oriented to person, place, and time. She has normal reflexes.  No clonus  Skin: Skin is warm and dry.  Psychiatric: She has a normal mood and affect. Her behavior is normal. Judgment and thought content normal.    Prenatal labs: ABO, Rh: O/POS/-- (10/16 1011) Antibody: NEG (10/16 1011) Rubella: 103.2 (10/16 1011) RPR: NON REAC (02/27 1638)  HBsAg: NEGATIVE (10/16 1011)  HIV: NON REACTIVE (10/16 1011)  GBS: Negative (05/13 0000)   Assessment/Plan: 1. [redacted]w[redacted]d 2. Active labor 3. GBS neg 4. Cat I FHT  1. Admit to [facility] w/ Dr. Stefano Gaul as attending 2. Routine L&D orders  3. Epidural ASAP 4. AROM/Pitocin prn further augmentation 5. C/w MD prn  Marcianna Daily H 07/03/2012, 9:08 PM

## 2012-07-03 NOTE — Anesthesia Preprocedure Evaluation (Signed)
Anesthesia Evaluation  Patient identified by MRN, date of birth, ID band Patient awake    Reviewed: Allergy & Precautions, H&P , NPO status , Patient's Chart, lab work & pertinent test results, reviewed documented beta blocker date and time   History of Anesthesia Complications Negative for: history of anesthetic complications  Airway Mallampati: II TM Distance: >3 FB Neck ROM: full    Dental  (+) Teeth Intact   Pulmonary asthma (last inhaler use 1 1/2 months ago) , former smoker (quit smoking prior to pregnancy),  breath sounds clear to auscultation        Cardiovascular negative cardio ROS  Rhythm:regular Rate:Normal     Neuro/Psych  Headaches (frequent headaches, better in pregnancy), negative psych ROS   GI/Hepatic negative GI ROS, Neg liver ROS,   Endo/Other  PCOS Obese - BMI 38.5  Renal/GU negative Renal ROS  negative genitourinary   Musculoskeletal   Abdominal   Peds  Hematology negative hematology ROS (+)   Anesthesia Other Findings   Reproductive/Obstetrics (+) Pregnancy                           Anesthesia Physical Anesthesia Plan  ASA: III  Anesthesia Plan: Epidural   Post-op Pain Management:    Induction:   Airway Management Planned:   Additional Equipment:   Intra-op Plan:   Post-operative Plan:   Informed Consent: I have reviewed the patients History and Physical, chart, labs and discussed the procedure including the risks, benefits and alternatives for the proposed anesthesia with the patient or authorized representative who has indicated his/her understanding and acceptance.     Plan Discussed with:   Anesthesia Plan Comments:         Anesthesia Quick Evaluation

## 2012-07-03 NOTE — Plan of Care (Signed)
Problem: Consults Goal: Birthing Suites Patient Information Press F2 to bring up selections list Outcome: Completed/Met Date Met:  07/03/12  Pt > [redacted] weeks EGA

## 2012-07-03 NOTE — MAU Note (Signed)
Pt presents with complaints of contractions that started early this morning that have became regular over the last couple of hours 2 to 3 mins apart. Pt also states that she is having some bloody show.

## 2012-07-03 NOTE — MAU Provider Note (Signed)
History   22 yo G3P0020 at [redacted]w[redacted]d presented with UCs that started around 0330 this am.  Pt reports that they began Q 5-7 min, then got closer but have now spaced out to 5-7 min again.  Increased d/c.  Denies VB, LOF, recent fever, resp or GI c/o's,UTI or PIH s/s . GFM.    Chief Complaint  Patient presents with  . Labor Eval    OB History   Grav Para Term Preterm Abortions TAB SAB Ect Mult Living   3    2 1 1    0      Past Medical History  Diagnosis Date  . H/O spontaneous abortion, currently pregnant 11/27/2011    SAB in Feb 2013  . H/O induced abortion 11/27/2011  . Headache   . Infection 2010;2011    Yeast x 2  . Infection     BV x 1  . Asthma     Triggered by grass and pollen;inhaler prn  . Vertigo     Not officially dx'd  . Bacterial vaginosis   . PCOS (polycystic ovarian syndrome)     Past Surgical History  Procedure Laterality Date  . Arm wound repair / closure    . Induced abortion      Family History  Problem Relation Age of Onset  . Stroke Maternal Grandmother   . Cancer Maternal Grandmother     History  Substance Use Topics  . Smoking status: Never Smoker   . Smokeless tobacco: Never Used     Comment: marijuana in the past  . Alcohol Use: 0.5 oz/week    1 drink(s) per week     Comment: dc'd +UPT    Allergies: No Known Allergies  Prescriptions prior to admission  Medication Sig Dispense Refill  . acetaminophen (TYLENOL) 500 MG tablet Take 500 mg by mouth every 6 (six) hours as needed. pain      . fluticasone (FLOVENT HFA) 110 MCG/ACT inhaler Inhale 1 puff into the lungs 2 (two) times daily as needed. Wheezing/shortness of breath      . Prenatal Vit-Fe Fumarate-FA (PRENATAL MULTIVITAMIN) TABS Take 1 tablet by mouth daily.       ROS: see HPI above, all other systems are negative  Physical Exam   Blood pressure 129/74, pulse 86, temperature 96.9 F (36.1 C), temperature source Oral, resp. rate 18, height 4\' 11"  (1.499 m), weight 190 lb 6 oz  (86.354 kg), last menstrual period 09/24/2011.  Chest: Clear Heart: RRR Abdomen: gravid, NT Extremities: WNL  Pelvic: Dilation: 2.5 Effacement (%): 70 Station: -2 Presentation: Vertex Exam by:: J. Jes Costales [name]  FHT: Cat I; reassuring; will continue to monitor for reactivity  UCs: 4-8 min  Dilation: 3 Effacement (%): 70 Station: -2 Presentation: Vertex Exam by:: J. Gerianne Simonet [name]  NST: Reactive UCs: Irregular Q 8-9 min  ED Course  IUP at [redacted]w[redacted]d Early labor  D/c home with labor precaution Will return with UCs Q 5 and more intense or if SROM    Haroldine Laws [name], [name] 07/03/2012 7:15 AM

## 2012-07-03 NOTE — Progress Notes (Signed)
Subjective: Received epidural around 2200; almost completely comfortable, but c/o urge to void.  Foley catheter to be placed by RN. Female visitor now at bs, where pt had come into [facility] alone earlier.  Objective: BP 136/88  Pulse 78  Temp(Src) 99.3 F (37.4 C) (Oral)  Resp 18  Ht 4\' 11"  (1.499 m)  Wt 190 lb (86.183 kg)  BMI 38.35 kg/m2  SpO2 98%  LMP 09/24/2011     .Marland Kitchen Filed Vitals:   07/03/12 2240 07/03/12 2245 07/03/12 2250 07/03/12 2300  BP: 124/84 122/69 120/59 136/88  Pulse: 82 81 81 78  Temp:      TempSrc:      Resp: 20 18 18 18   Height:      Weight:      SpO2: 100% 100% 98%    FHT:  FHR: 145 bpm, variability: moderate,  accelerations:  Present,  decelerations:  Absent UC:   regular, every 2-4 minutes SVE:   Dilation: 6.5 Effacement (%): 90 Station: -2 Exam by:: Refugia Laneve, [name] BBOW noted; mod show Labs: .Marland Kitchen Results for orders placed during the hospital encounter of 07/03/12 (from the past 24 hour(s))  CBC     Status: Abnormal   Collection Time    07/03/12  9:15 PM      Result Value Range   WBC 18.2 (*) 4.0 - 10.5 K/uL   RBC 4.14  3.87 - 5.11 MIL/uL   Hemoglobin 11.1 (*) 12.0 - 15.0 g/dL   HCT 16.1 (*) 09.6 - 04.5 %   MCV 81.6  78.0 - 100.0 fL   MCH 26.8  26.0 - 34.0 pg   MCHC 32.8  30.0 - 36.0 g/dL   RDW 40.9  81.1 - 91.4 %   Platelets 208  150 - 400 K/uL  COMPREHENSIVE METABOLIC PANEL     Status: Abnormal   Collection Time    07/03/12  9:15 PM      Result Value Range   Sodium 134 (*) 135 - 145 mEq/L   Potassium 3.8  3.5 - 5.1 mEq/L   Chloride 100  96 - 112 mEq/L   CO2 23  19 - 32 mEq/L   Glucose, Bld 115 (*) 70 - 99 mg/dL   BUN 7  6 - 23 mg/dL   Creatinine, Ser 7.82  0.50 - 1.10 mg/dL   Calcium 9.7  8.4 - 95.6 mg/dL   Total Protein 6.4  6.0 - 8.3 g/dL   Albumin 3.1 (*) 3.5 - 5.2 g/dL   AST 17  0 - 37 U/L   ALT 12  0 - 35 U/L   Alkaline Phosphatase 157 (*) 39 - 117 U/L   Total Bilirubin 0.7  0.3 - 1.2 mg/dL   GFR calc non Af Amer >90  >90 mL/min    GFR calc Af Amer >90  >90 mL/min  LACTATE DEHYDROGENASE     Status: Abnormal   Collection Time    07/03/12  9:15 PM      Result Value Range   LDH 256 (*) 94 - 250 U/L  URIC ACID     Status: None   Collection Time    07/03/12  9:15 PM      Result Value Range   Uric Acid, Serum 5.7  2.4 - 7.0 mg/dL    Assessment / Plan: Spontaneous labor, progressing normally  Labor: active labor at [redacted]w[redacted]d Preeclampsia:  intake and ouput balanced Fetal Wellbeing:  Category I Pain Control:  Epidural I/D:  n/a Anticipated MOD:  NSVD 1. LDH and WBC elevated, but other labs WNL; will CTO BP closely. 2. AROM prn further augmentation 3. C/w MD prn Ann Dunn 07/03/2012, 11:38 PM

## 2012-07-03 NOTE — Anesthesia Procedure Notes (Signed)
Epidural Patient location during procedure: OB Start time: 07/03/2012 10:11 PM  Staffing Performed by: anesthesiologist   Preanesthetic Checklist Completed: patient identified, site marked, surgical consent, pre-op evaluation, timeout performed, IV checked, risks and benefits discussed and monitors and equipment checked  Epidural Patient position: sitting Prep: site prepped and draped and DuraPrep Patient monitoring: continuous pulse ox and blood pressure Approach: midline Injection technique: LOR air  Needle:  Needle type: Tuohy  Needle gauge: 17 G Needle length: 9 cm and 9 Needle insertion depth: 5 cm cm Catheter type: closed end flexible Catheter size: 19 Gauge Catheter at skin depth: 10 cm Test dose: negative  Assessment Events: blood not aspirated, injection not painful, no injection resistance, negative IV test and no paresthesia  Additional Notes Discussed risk of headache, infection, bleeding, nerve injury and failed or incomplete block.  Patient voices understanding and wishes to proceed.  Epidural placed easily on first attempt.  No paresthesia.  Patient tolerated procedure well with no apparent complications.  Jasmine December, MDReason for block:procedure for pain

## 2012-07-03 NOTE — MAU Note (Signed)
Painful contractions since 320am. Denies leaking of fluid.

## 2012-07-04 ENCOUNTER — Encounter (HOSPITAL_COMMUNITY): Payer: Self-pay | Admitting: Obstetrics and Gynecology

## 2012-07-04 MED ORDER — OXYCODONE-ACETAMINOPHEN 5-325 MG PO TABS
1.0000 | ORAL_TABLET | ORAL | Status: DC | PRN
  Administered 2012-07-06: 1 via ORAL
  Filled 2012-07-04: qty 1

## 2012-07-04 MED ORDER — FLUTICASONE PROPIONATE HFA 110 MCG/ACT IN AERO
1.0000 | INHALATION_SPRAY | Freq: Two times a day (BID) | RESPIRATORY_TRACT | Status: DC | PRN
  Filled 2012-07-04: qty 12

## 2012-07-04 MED ORDER — SENNOSIDES-DOCUSATE SODIUM 8.6-50 MG PO TABS
2.0000 | ORAL_TABLET | Freq: Every day | ORAL | Status: DC
  Administered 2012-07-04 – 2012-07-06 (×3): 2 via ORAL

## 2012-07-04 MED ORDER — BENZOCAINE-MENTHOL 20-0.5 % EX AERO
1.0000 "application " | INHALATION_SPRAY | CUTANEOUS | Status: DC | PRN
  Administered 2012-07-06: 1 via TOPICAL
  Filled 2012-07-04: qty 56

## 2012-07-04 MED ORDER — IBUPROFEN 600 MG PO TABS
600.0000 mg | ORAL_TABLET | Freq: Four times a day (QID) | ORAL | Status: DC
  Administered 2012-07-04 – 2012-07-07 (×12): 600 mg via ORAL
  Filled 2012-07-04 (×11): qty 1

## 2012-07-04 MED ORDER — SIMETHICONE 80 MG PO CHEW: 80.0000 mg | CHEWABLE_TABLET | ORAL | Status: DC | PRN

## 2012-07-04 MED ORDER — TERBUTALINE SULFATE 1 MG/ML IJ SOLN: 0.2500 mg | Freq: Once | INTRAMUSCULAR | Status: DC | PRN

## 2012-07-04 MED ORDER — ONDANSETRON HCL 4 MG PO TABS: 4.0000 mg | ORAL_TABLET | ORAL | Status: DC | PRN

## 2012-07-04 MED ORDER — WITCH HAZEL-GLYCERIN EX PADS: 1.0000 "application " | MEDICATED_PAD | CUTANEOUS | Status: DC | PRN

## 2012-07-04 MED ORDER — OXYTOCIN 40 UNITS IN LACTATED RINGERS INFUSION - SIMPLE MED
1.0000 m[IU]/min | INTRAVENOUS | Status: DC
  Administered 2012-07-04: 1 m[IU]/min via INTRAVENOUS

## 2012-07-04 MED ORDER — DIBUCAINE 1 % RE OINT: 1.0000 "application " | TOPICAL_OINTMENT | RECTAL | Status: DC | PRN

## 2012-07-04 MED ORDER — TETANUS-DIPHTH-ACELL PERTUSSIS 5-2.5-18.5 LF-MCG/0.5 IM SUSP
0.5000 mL | Freq: Once | INTRAMUSCULAR | Status: AC
  Administered 2012-07-05: 0.5 mL via INTRAMUSCULAR
  Filled 2012-07-04: qty 0.5

## 2012-07-04 MED ORDER — LANOLIN HYDROUS EX OINT: TOPICAL_OINTMENT | CUTANEOUS | Status: DC | PRN

## 2012-07-04 MED ORDER — ZOLPIDEM TARTRATE 5 MG PO TABS: 5.0000 mg | ORAL_TABLET | Freq: Every evening | ORAL | Status: DC | PRN

## 2012-07-04 MED ORDER — PRENATAL MULTIVITAMIN CH
1.0000 | ORAL_TABLET | Freq: Every day | ORAL | Status: DC
  Administered 2012-07-05 – 2012-07-07 (×3): 1 via ORAL
  Filled 2012-07-04 (×3): qty 1

## 2012-07-04 MED ORDER — DIPHENHYDRAMINE HCL 25 MG PO CAPS: 25.0000 mg | ORAL_CAPSULE | Freq: Four times a day (QID) | ORAL | Status: DC | PRN

## 2012-07-04 MED ORDER — ONDANSETRON HCL 4 MG/2ML IJ SOLN: 4.0000 mg | INTRAMUSCULAR | Status: DC | PRN

## 2012-07-04 NOTE — Anesthesia Postprocedure Evaluation (Signed)
Anesthesia Post Note  Patient: Ann Dunn  Procedure(s) Performed: * No procedures listed *  Anesthesia type: Epidural  Patient location: Mother/Baby  Post pain: Pain level controlled  Post assessment: Post-op Vital signs reviewed  Last Vitals:  Filed Vitals:   07/04/12 1406  BP: 135/95  Pulse:   Temp:   Resp:     Post vital signs: Reviewed  Level of consciousness:alert  Complications: No apparent anesthesia complications

## 2012-07-04 NOTE — Progress Notes (Signed)
Subjective: Drowsy s/p Benadryl for itching.  Some intermittent pressure.  Spontaneous labor s/p SROM.  Female and female visitors asleep at bs.    Objective: BP 121/76  Pulse 81  Temp(Src) 99 F (37.2 C) (Oral)  Resp 18  Ht 4\' 11"  (1.499 m)  Wt 190 lb (86.183 kg)  BMI 38.35 kg/m2  SpO2 98%  LMP 09/24/2011   Total I/O In: -  Out: 400 [Urine:400]  FHT:  FHR: 125 bpm, variability: moderate,  accelerations:  Present,  decelerations:  Absent UC:   regular, every 2-4 minutes SVE:   Anterior lip/0 to +1  Labs: Lab Results  Component Value Date   WBC 18.2* 07/03/2012   HGB 11.1* 07/03/2012   HCT 33.8* 07/03/2012   MCV 81.6 07/03/2012   PLT 208 07/03/2012    Assessment / Plan: 1. [redacted]w[redacted]d 2. transition 3. anterior lip 4. GBS neg  Labor: Progressing normally Preeclampsia:  no signs or symptoms of toxicity Fetal Wellbeing:  Category I Pain Control:  Epidural I/D:  n/a Anticipated MOD:  NSVD  1. Placed pt in Lt exaggerated sims to help w/ internal rotation.   2. Recheck in 1-2 hrs, or prn 3. C/w MD prn  Ann Dunn 07/04/2012, 4:16 AM

## 2012-07-04 NOTE — Progress Notes (Signed)
  Subjective: Feeling some pain with UCs in lower abdomen, but no real urge to push.  Objective: BP 130/80  Pulse 78  Temp(Src) 99.3 F (37.4 C) (Oral)  Resp 18  Ht 4\' 11"  (1.499 m)  Wt 190 lb (86.183 kg)  BMI 38.35 kg/m2  SpO2 98%  LMP 09/24/2011 I/O last 3 completed shifts: In: -  Out: 1000 [Urine:1000]    FHT:  Category 1 UC:   q 2-4 min, with some gaps in pattern of contractions. SVE:   Dilation: 10 Effacement (%): 100 Station: +2 Exam by:: Steelman, [name] Baby does move with active push, but minimal sensation of pressure. Vtx at +1 by my exam. Contractions strength feels minimal to internal evaluation.  Assessment / Plan: Will stop active pushing Start low dose pitocin to facilitate UC quality. Re-evaluate descent after pitocin initiated.  Zeus Marquis 07/04/2012, 8:21 AM

## 2012-07-05 LAB — CBC
MCH: 26.6 pg (ref 26.0–34.0)
MCHC: 32.5 g/dL (ref 30.0–36.0)
MCV: 82 fL (ref 78.0–100.0)
Platelets: 188 10*3/uL (ref 150–400)

## 2012-07-05 MED ORDER — PNEUMOCOCCAL VAC POLYVALENT 25 MCG/0.5ML IJ INJ
0.5000 mL | INJECTION | Freq: Once | INTRAMUSCULAR | Status: AC
  Administered 2012-07-05: 0.5 mL via INTRAMUSCULAR
  Filled 2012-07-05: qty 0.5

## 2012-07-05 NOTE — Progress Notes (Signed)
Post Partum Day 1:S/P SVB, right periurethral laceration Subjective: Patient up ad lib, denies syncope or dizziness. Feeding:  Breast/bottle Contraceptive plan:   Undecided at present  Objective: Blood pressure 121/78, pulse 78, temperature 97.9 F (36.6 C), temperature source Oral, resp. rate 18, height 4\' 11"  (1.499 m), weight 190 lb (86.183 kg), last menstrual period 09/24/2011, SpO2 98.00%, unknown if currently breastfeeding.  Orthostatics stable.  Physical Exam:  General: alert Lochia: appropriate Uterine Fundus: firm Incision: healing well DVT Evaluation: No evidence of DVT seen on physical exam. Negative Homan's sign.   Recent Labs  07/03/12 2115 07/05/12 0605  HGB 11.1* 9.0*  HCT 33.8* 27.7*  WBC ct 20 (18.2 on admission)  Assessment/Plan: S/P Vaginal delivery day 1 Anemia without hemodynamic instability Mild leukocytosis  Plan: Continue current care Repeat CBC tomorrow    LOS: 2 days   Ann Dunn 07/05/2012, 7:58 AM

## 2012-07-05 NOTE — Clinical Social Work Maternal (Signed)
    Clinical Social Work Department PSYCHOSOCIAL ASSESSMENT - MATERNAL/CHILD 07/05/2012  Patient:  Ann Dunn, Ann Dunn  Account Number:  [id number]  Admit Date:  07/03/2012  Ann Bicker Name:   Darcella Dunn    Clinical Social Worker:  Nobie Putnam, LCSW   Date/Time:  07/05/2012 12:46 PM  Date Referred:  07/05/2012   Referral source  CN     Referred reason  Substance Abuse   Other referral source:    I:  FAMILY / HOME ENVIRONMENT Child's legal guardian:  PARENT  Guardian - Name Guardian - Age Guardian - Address  Ann Dunn 21 [phone]; [place], [place] [address]  Ann Dunn 25 (same as above)   Other household support members/support persons Other support:   [name], mother    II  PSYCHOSOCIAL DATA Information Source:  Patient Interview  Event organiser Employment:   Surveyor, quantity resources:  [employer] If Medicaid - County:  [place] Other  WIC   School / Grade:  [employer] social research officer / Statistician / Early Interventions:  Cultural issues impacting care:    III  STRENGTHS Strengths  Adequate Resources  Home prepared for Child (including basic supplies)  Supportive family/friends   Strength comment:    IV  RISK FACTORS AND CURRENT PROBLEMS Current Problem:  YES   Risk Factor & Current Problem Patient Issue Family Issue Risk Factor / Current Problem Comment  Substance Abuse Y N Hx of MJ use   N N     V  SOCIAL WORK ASSESSMENT CSW met with pt to discuss MJ use history.  Pt told CSW that she stopped smoking MJ in May 2013, prior to pregnancy. She denies any other illegal substance use history.  CSW explained hospital drug testing policy. Pt is confident that results will be negative.  UDS & meconium results are pending.  Pt has all the necessary supplies for the infant.  CSW observed pt bonding well with the infant & appears to be appropriate at this time.  CSW will continue to monitor drug screen results & make a  referral if needed.      VI SOCIAL WORK PLAN Social Work Plan  No Further Intervention Required / No Barriers to Discharge   Type of pt/family education:   If child protective services report - county:   If child protective services report - date:   Information/referral to community resources comment:   Other social work plan:

## 2012-07-05 NOTE — Progress Notes (Signed)
UR chart review completed.  

## 2012-07-06 LAB — CBC WITH DIFFERENTIAL/PLATELET
Basophils Absolute: 0 10*3/uL (ref 0.0–0.1)
Basophils Relative: 0 % (ref 0–1)
Eosinophils Relative: 1 % (ref 0–5)
HCT: 28.2 % — ABNORMAL LOW (ref 36.0–46.0)
Lymphocytes Relative: 15 % (ref 12–46)
MCHC: 32.6 g/dL (ref 30.0–36.0)
Monocytes Absolute: 1 10*3/uL (ref 0.1–1.0)
Neutro Abs: 10.9 10*3/uL — ABNORMAL HIGH (ref 1.7–7.7)
Platelets: 206 10*3/uL (ref 150–400)
RDW: 14.6 % (ref 11.5–15.5)
WBC: 13.9 10*3/uL — ABNORMAL HIGH (ref 4.0–10.5)

## 2012-07-06 MED ORDER — FERROUS SULFATE 325 (65 FE) MG PO TABS
325.0000 mg | ORAL_TABLET | Freq: Two times a day (BID) | ORAL | Status: DC
  Administered 2012-07-06 – 2012-07-07 (×2): 325 mg via ORAL
  Filled 2012-07-06: qty 1

## 2012-07-06 NOTE — Progress Notes (Signed)
Post Partum Day 2: S/P SVD with periurethral lac  Subjective: Patient up ad lib, denies syncope or dizziness.  Pt c/o the need for baby drug screen.  They have not been able to collect an adequate sample yet, and she was not told until yesterday why they were collecting baby urine.  Feeding:  Breast/bottle Contraceptive plan:   Condoms   Objective: Blood pressure 110/67, pulse 68, temperature 97.7 F (36.5 C), temperature source Oral, resp. rate 20, height 4\' 11"  (1.499 m), weight 190 lb (86.183 kg), last menstrual period 09/24/2011, SpO2 97.00%, unknown if currently breastfeeding.  Physical Exam:  General: alert, cooperative and no distress Lochia: appropriate Uterine Fundus: firm Incision: healing well DVT Evaluation: No evidence of DVT seen on physical exam. Negative Homan's sign.   Recent Labs  07/05/12 0605 07/06/12 0641  HGB 9.0* 9.2*  HCT 27.7* 28.2*    Assessment/Plan: S/P Vaginal delivery day 2 Continue current care Rx Iron BID Plan for discharge tomorrow   LOS: 3 days   Marcial Pless 07/06/2012, 8:35 AM

## 2012-07-07 ENCOUNTER — Inpatient Hospital Stay (HOSPITAL_COMMUNITY): Admission: RE | Admit: 2012-07-07 | Payer: PRIVATE HEALTH INSURANCE | Source: Ambulatory Visit

## 2012-07-07 MED ORDER — PRENATAL MULTIVITAMIN CH: 1.0000 | ORAL_TABLET | Freq: Every day | ORAL | Status: DC

## 2012-07-07 MED ORDER — IBUPROFEN 600 MG PO TABS: 600.0000 mg | ORAL_TABLET | Freq: Four times a day (QID) | ORAL | Status: DC

## 2012-07-07 MED ORDER — FERROUS SULFATE 325 (65 FE) MG PO TABS: 325.0000 mg | ORAL_TABLET | Freq: Two times a day (BID) | ORAL | Status: DC

## 2012-07-07 NOTE — Discharge Summary (Signed)
  Vaginal Delivery Discharge Summary  KAYDYN CHISM  DOB:    [birth year] MRN:    [id number] CSN:    [id number]  Date of admission:                  07/03/12  Date of discharge:                   07/07/12  Procedures this admission: SVD with repair of periurethral lac  Date of Delivery: 07/04/12  Newborn Data:  Live born female  Birth Weight: 6 lb 12.6 oz (3079 g) APGAR: 8, 9  Home with mother. Name: :"Naima" Circumcision Plan: n/a  History of Present Illness:  Ms. SHAMICA MOREE is a 22 y.o. female, [id number], who presents at [redacted]w[redacted]d weeks gestation. The patient has been followed at the [facility] and Gynecology division of [facility]. She was admitted onset of labor. Her pregnancy has been complicated by:  Patient Active Problem List   Diagnosis Date Noted  . Periurethral laceration, delivered, current hospitalization 07/04/2012  . Vaginal delivery 07/04/2012  . 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 H/O spontaneous abortion, currently pregnant 11/27/2011  . H/O induced abortion 11/27/2011  . 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 Asthma 11/15/2011     Hospital course:  The patient was admitted for labor.   Her labor was not complicated. She proceeded to have a vaginal delivery of a healthy infant. Her delivery was not complicated. Her postpartum course was not complicated.  She was discharged to home on postpartum day 3 doing well.  Feeding:  breast  Contraception:  no method - discussed options however pt not interested in any method at this time  Discharge hemoglobin:  Hemoglobin  Date Value Range Status  07/06/2012 9.2* 12.0 - 15.0 g/dL Final     HCT  Date Value Range Status  07/06/2012 28.2* 36.0 - 46.0 % Final    Discharge Physical Exam:   General: alert, cooperative and no distress Lochia: appropriate Uterine Fundus: firm Incision: healing well DVT Evaluation: No evidence of DVT  seen on physical exam. Negative Homan's sign.  Intrapartum Procedures: spontaneous vaginal delivery Postpartum Procedures: none Complications-Operative and Postpartum: periurethral laceration  Discharge Diagnoses: Term Pregnancy-delivered and mild, asymptomatic anemia  Discharge Information:  Activity:           per [facility] handbook Diet:                routine Medications: PNV, Ibuprofen and Iron Condition:      stable Instructions:  refer to practice specific booklet Discharge to: home     Haroldine Laws 07/07/2012

## 2012-07-07 NOTE — Discharge Summary (Signed)
Obstetric Discharge Summary  Reason for Admission: onset of labor Prenatal Procedures: none Intrapartum Procedures: spontaneous vaginal delivery  Postpartum Procedures: none Complications-Operative and Postpartum: none  Hemoglobin  Date Value Range Status  07/06/2012 9.2* 12.0 - 15.0 g/dL Final     HCT  Date Value Range Status  07/06/2012 28.2* 36.0 - 46.0 % Final    Discharge Diagnoses: Term Pregnancy-delivered  Discharge Information:  Date: 07/07/2012 Activity: unrestricted Diet: routine Medications: Ibuprofen and Percocet Condition: stable  Breastfeeding: yes  Instructions: refer to practice specific booklet Discharge to: home Follow-up Information   Follow up with [facility]. Schedule an appointment as soon as possible for a visit in 6 weeks. (Call with any questions or concerns)    Contact information:   [address] [place] [address] [phone]      Newborn Data: Live born  Information for the patient's newborn:  Jeneal, Vogl [[id number]]  female  Home with mother.  Liliah Dorian A MD 07/07/2012, 1:12 PM

## 2012-08-14 ENCOUNTER — Encounter: Payer: Self-pay | Admitting: Obstetrics and Gynecology

## 2012-11-08 IMAGING — US US PELVIS COMPLETE
1 series · 14 of 25 positions shown · non-contrast
Comparison: None.

CLINICAL DATA: Polycystic ovarian disease. Irregular menstrual
cycles.

TRANSABDOMINAL AND TRANSVAGINAL ULTRASOUND OF PELVIS
TECHNIQUE: Both transabdominal and transvaginal ultrasound
examinations of the pelvis were performed. Transabdominal technique
was performed for global imaging of the pelvis including uterus,
ovaries, adnexal regions, and pelvic cul-de-sac.

[Series 1: us pelvis complete · 0.26mm/px · 14 of 52 slices shown]
[im 1/52]
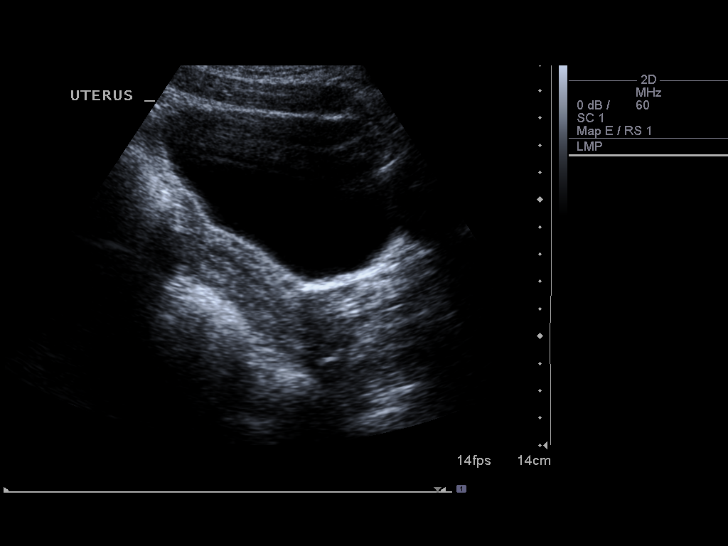
[im 5/52]
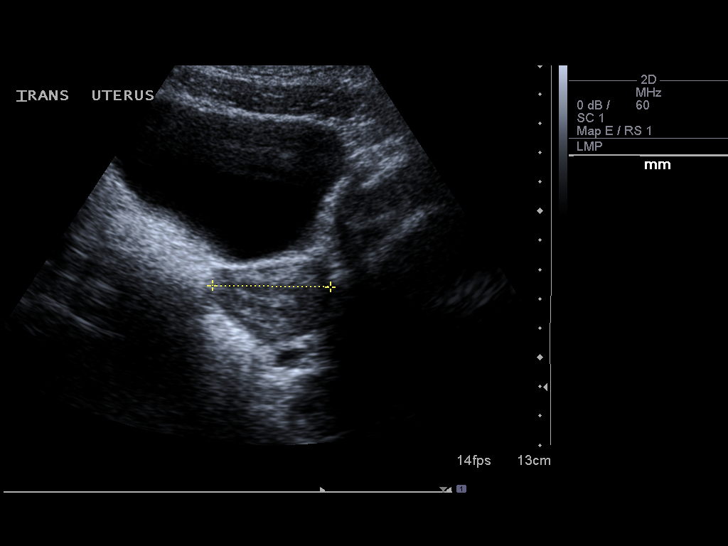
[im 9/52]
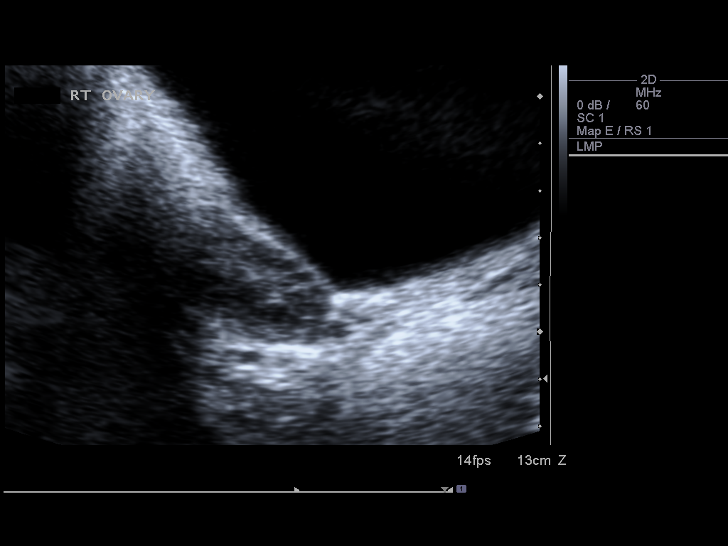
[im 13/52]
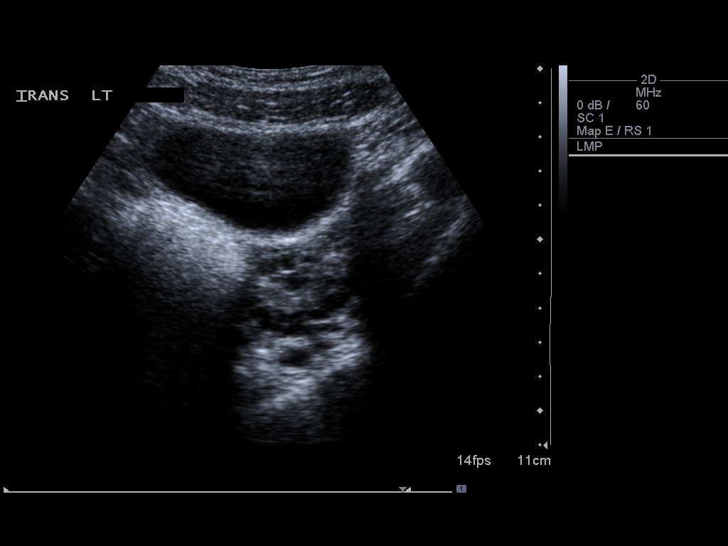
[im 18/52]
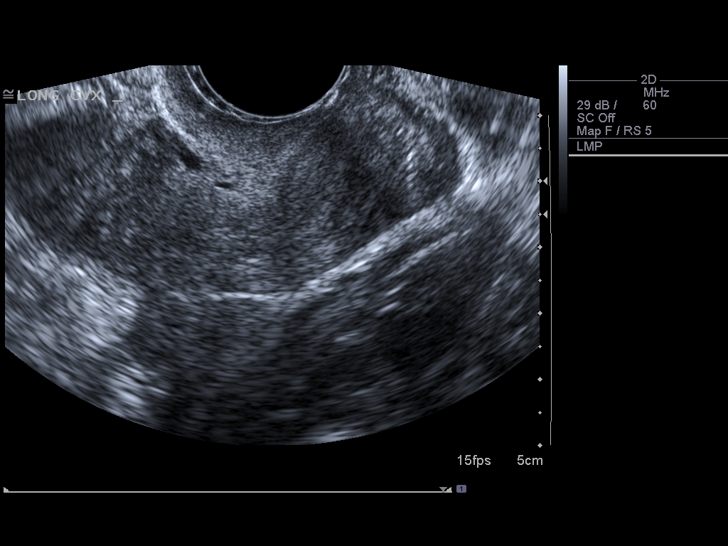
[im 20/52]
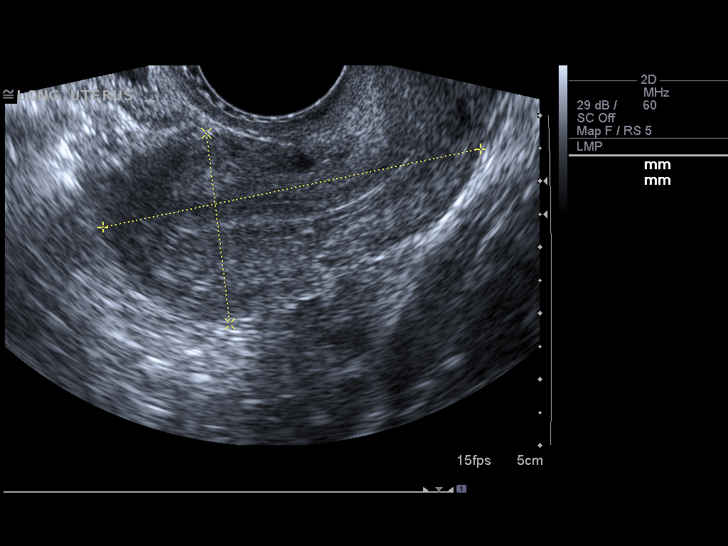
[im 24/52]
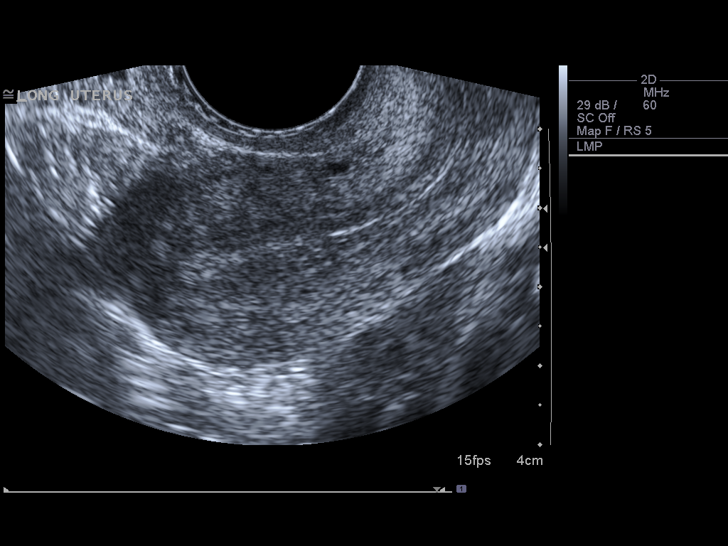
[im 28/52]
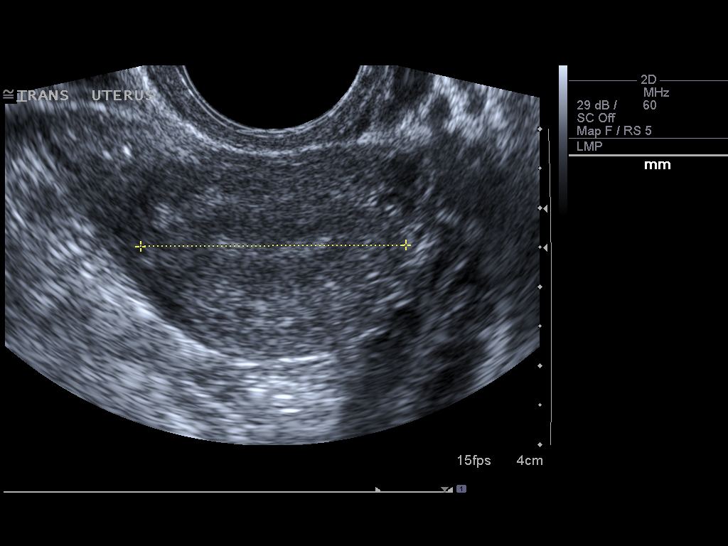
[im 32/52]
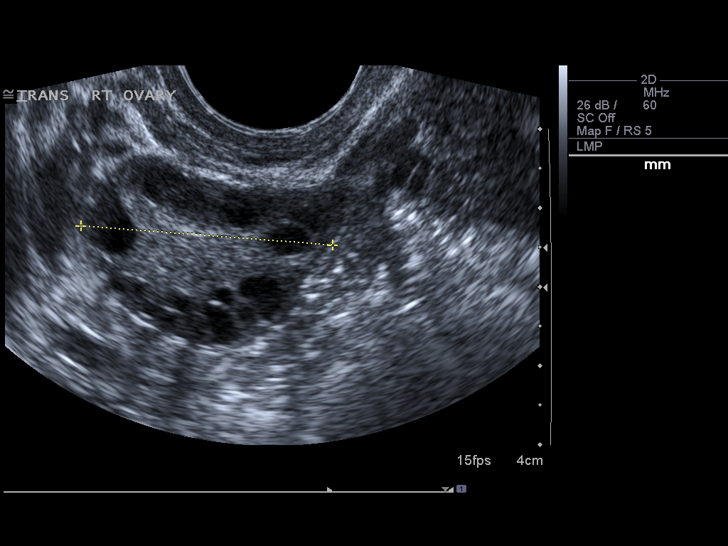
[im 35/52]
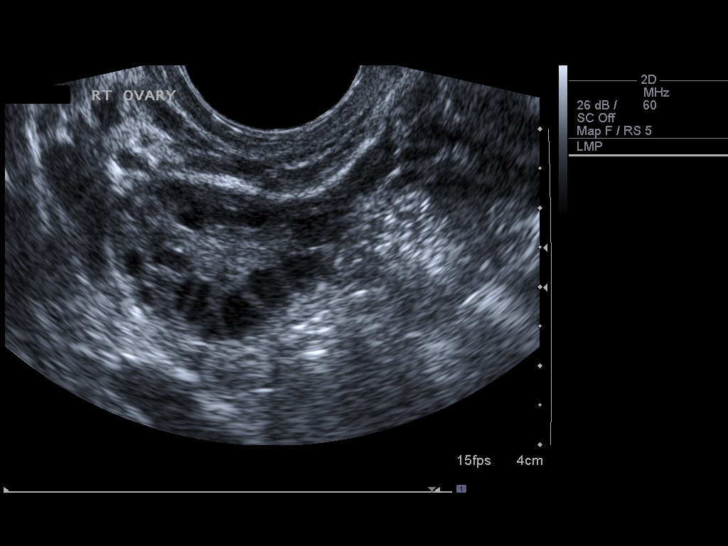
[im 39/52]
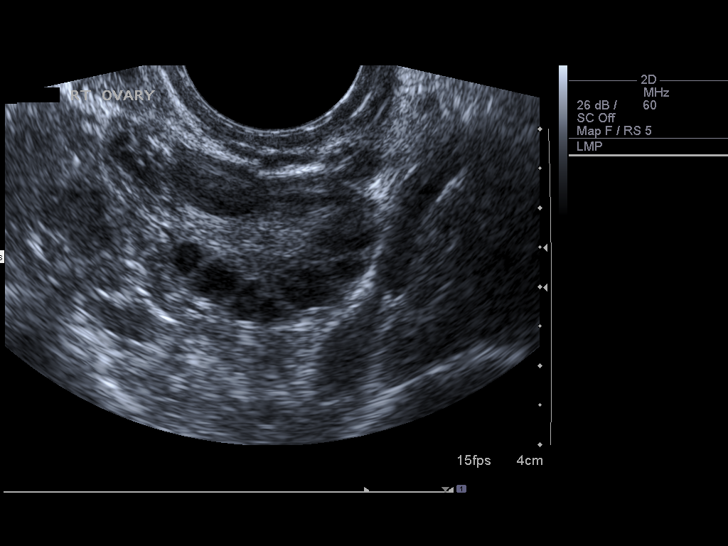
[im 43/52]
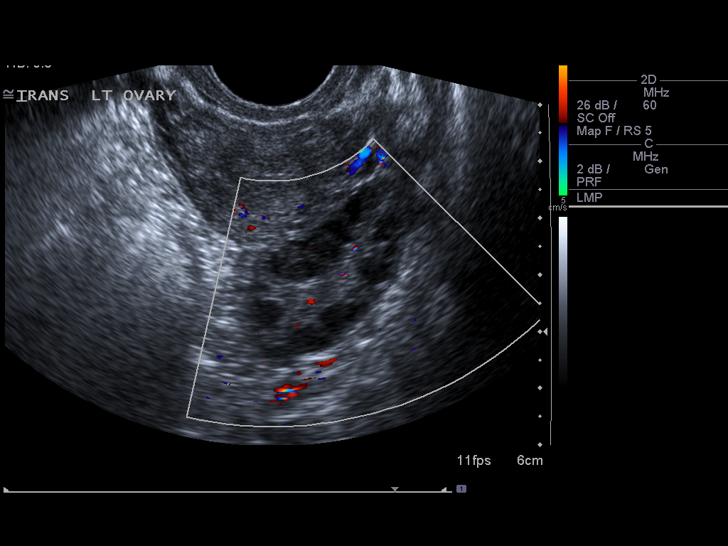
[im 47/52]
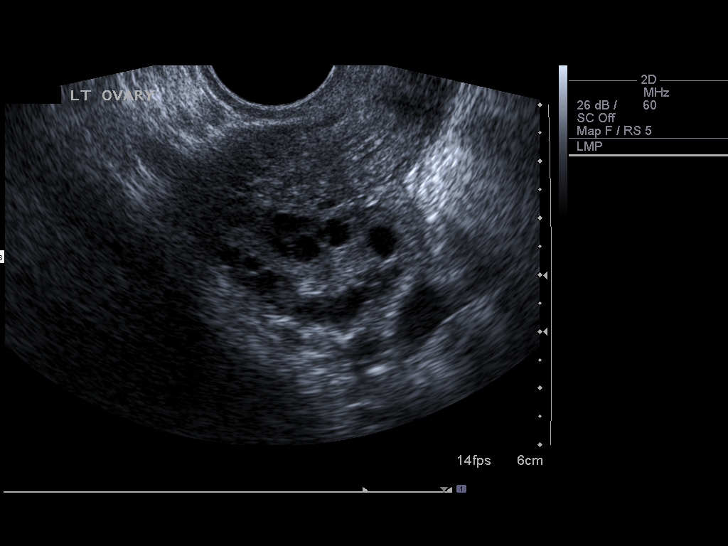
[im 52/52]
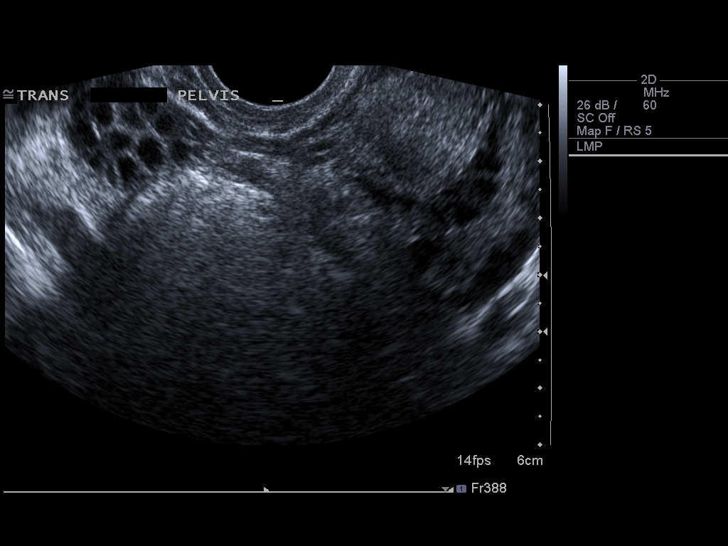

[14 of 25 positions shown; findings below may reference images not displayed]

It was necessary to proceed with endovaginal exam following the
transabdominal exam to visualize the uterus, endometrium and
ovaries.
FINDINGS: Uterus: Measures 7.2 x 2.1 x 4.0 cm with uniform parenchymal echo
texture.

Endometrium: Measures 4 mm.

Right ovary:  Measures 3.4 x 2.1 x 3.2 cm with multiple
peripherally oriented follicles and echogenic central ovarian
stroma.

Left ovary: Measures 3.8 x 2.2 x 2.5 cm with multiple peripherally
oriented follicles and echogenic central ovarian stroma.

Other findings: No free fluid.
IMPRESSION: Multiple peripherally oriented ovarian follicles and echogenic
central ovarian stroma bilaterally.  These findings can be seen
with polycystic ovarian disease.

## 2013-09-07 ENCOUNTER — Inpatient Hospital Stay (HOSPITAL_COMMUNITY)
Admission: AD | Admit: 2013-09-07 | Discharge: 2013-09-07 | Disposition: A | Discharge status: Home / Self Care | Payer: Medicaid Other | Source: Ambulatory Visit | Attending: Obstetrics & Gynecology | Admitting: Obstetrics & Gynecology

## 2013-09-07 ENCOUNTER — Encounter (HOSPITAL_COMMUNITY): Payer: Self-pay | Admitting: *Deleted

## 2013-09-07 DIAGNOSIS — Z3201 Encounter for pregnancy test, result positive: Secondary | ICD-10-CM | POA: Insufficient documentation

## 2013-09-07 DIAGNOSIS — Z3200 Encounter for pregnancy test, result unknown: Secondary | ICD-10-CM | POA: Diagnosis present

## 2013-09-07 LAB — POCT PREGNANCY, URINE: Preg Test, Ur: POSITIVE — AB

## 2013-09-07 NOTE — Discharge Instructions (Signed)
Pregnancy  If you are planning on getting pregnant, it is a good idea to make a preconception appointment with your health care provider to discuss having a healthy lifestyle before getting pregnant. This includes diet, weight, exercise, taking prenatal vitamins (especially folic acid, which helps prevent brain and spinal cord defects), avoiding alcohol, quitting smoking and illegal drugs, discussing medical problems (diabetes, heart disease, convulsions) and family history of genetic problems, your working conditions, and your immunizations. It is better to have knowledge of these things and do something about them before getting pregnant.   During your pregnancy, it is important to follow certain guidelines in order to have a healthy baby. It is very important to get good prenatal care and follow your health care provider's instructions. Prenatal care includes all the medical care you receive before your baby's birth. This helps to prevent problems during the pregnancy and childbirth.   HOME CARE INSTRUCTIONS    Start your prenatal visits by the 12th week of pregnancy or earlier, if possible. At first, appointments are usually scheduled monthly. They become more frequent in the last 2 months before delivery. It is important that you keep your health care provider's appointments and follow his or her instructions regarding medicine use, exercise, and diet.   During pregnancy, you are providing food for you and your baby. Eat a regular, well-balanced diet. Choose foods such as meat, fish, milk and other dairy products, vegetables, fruits, and whole-grain breads and cereals. Your health care provider will inform you of your ideal weight gain during pregnancy depending on your current height and weight. Drink plenty of fluids. Try to drink 8 glasses of water a day.   Alcohol is related to a number of birth defects, including fetal alcohol syndrome. It is best to avoid alcohol completely. Smoking will cause low  birth rate and prematurity. Use of alcohol and nicotine during your pregnancy also increases the chances that your child will be chemically dependent later in life and may contribute to sudden infant death syndrome, or SIDS.   Do not use illegal drugs.   Only take medicines as directed by your health care provider. Some medicines can cause genetic and physical problems in your baby.   Morning sickness can often be helped by keeping soda crackers at the bedside. Eat a few crackers before getting up in the morning.   A sexual relationship may be continued until near the end of your pregnancy if there are no other problems such as early (premature) leaking of amniotic fluid from the membranes, vaginal bleeding, painful intercourse, or abdominal pain.   Exercise regularly. Check with your health care provider if you are unsure about whether your exercises are safe.   Do not use hot tubs, steam rooms, or saunas. These increase the risk of fainting and you hurting yourself and your baby. Swimming is okay for exercise. Get plenty of rest, including afternoon naps when possible, especially in the third trimester.   Avoid toxic odors and chemicals.   Do not wear high heels. They may cause you to lose your balance and fall.   Do not lift over 5 pounds (2.3 kg). If you do lift anything, lift with your legs and thighs, not your back.   Avoid traveling, especially in the third trimester. If you have to travel out of the city or state, take a copy of your medical records with you.   Learn about, and consider, breastfeeding your baby.  SEEK IMMEDIATE MEDICAL CARE IF:      You have a fever.   You have leaking of fluid from your vagina. If you think your water broke, take your temperature and tell your health care provider of this when you call.   You have vaginal spotting or bleeding. Tell your health care provider of the amount and how many pads are used.   You continue to feel nauseous and have no relief from  remedies suggested, or you vomit blood or coffee ground-like materials.   You have upper abdominal pain.   You have round ligament discomfort in the lower abdominal area. This still must be evaluated by your health care provider.   You feel contractions.   You do not feel the baby move, or there is less movement than before.   You have painful urination.   You have abnormal vaginal discharge.   You have persistent diarrhea.   You have a severe headache.   You have problems with your vision.   You have muscle weakness.   You feel dizzy and faint.   You have shortness of breath.   You have chest pain.   You have back pain that travels down to your legs and feet.   You feel your heart is beating fast or not regular, like it skips a beat.   You gain a lot of weight in a short period of time (5 pounds in 3-5 days).   You are involved in a domestic violence situation.  Document Released: 02/06/2005 Document Revised: 02/11/2013 Document Reviewed: 07/31/2008  ExitCare Patient Information 2015 [employer]. This information is not intended to replace advice given to you by your health care provider. Make sure you discuss any questions you have with your health care provider.

## 2013-09-07 NOTE — MAU Provider Note (Signed)
  History     CSN: [id number]  Arrival date and time: 09/07/13 [id number]   None     Chief Complaint  Patient presents with  . Possible Pregnancy   Patient is a 23 y.o. female presenting with pregnancy problem.  Possible Pregnancy Pertinent negatives include no abdominal pain, chills, fever, nausea or vomiting.    Pt is G4P1021 @[redacted]w[redacted]d  pregnant by LMP 08/01/2013 who presents for pregnancy confirmation.  Pt denies any N/V Spotting, bleeding or cramping.  [facility]  Past Medical History  Diagnosis Date  . H/O spontaneous abortion, currently pregnant 11/27/2011    SAB in Feb 2013  . H/O induced abortion 11/27/2011  . Headache(784.0)   . Infection 2010;2011    Yeast x 2  . Infection     BV x 1  . Asthma     Triggered by grass and pollen;inhaler prn  . Vertigo     Not officially dx'd  . Bacterial vaginosis   . PCOS (polycystic ovarian syndrome)   . Active labor at term 07/03/2012    Past Surgical History  Procedure Laterality Date  . Arm wound repair / closure    . Induced abortion      Family History  Problem Relation Age of Onset  . Stroke Maternal Grandmother   . Cancer Maternal Grandmother     History  Substance Use Topics  . Smoking status: Never Smoker   . Smokeless tobacco: Never Used     Comment: marijuana in the past  . Alcohol Use: 0.5 oz/week    1 drink(s) per week     Comment: dc'd +UPT    Allergies: No Known Allergies  No prescriptions prior to admission    Review of Systems  Constitutional: Negative for fever and chills.  Gastrointestinal: Negative for nausea, vomiting and abdominal pain.  Genitourinary: Negative for dysuria.   Physical Exam   Blood pressure 118/62, pulse 62, resp. rate 18, last menstrual period 08/01/2013, unknown if currently breastfeeding.  Physical Exam  Nursing note and vitals reviewed. Constitutional: She is oriented to person, place, and time. She appears well-developed and  well-nourished. No distress.  Eyes: Pupils are equal, round, and reactive to light.  Neck: Normal range of motion.  Cardiovascular: Normal rate.   Respiratory: Effort normal.  GI: Soft.  Musculoskeletal: Normal range of motion.  Neurological: She is alert and oriented to person, place, and time.  Skin: Skin is warm and dry.  Psychiatric: She has a normal mood and affect.    MAU Course  Procedures     Assessment and Plan  Positive pregnancy test   [facility] HospitalINEBERRY,Ann Dunn 09/07/2013, 7:54 AM

## 2013-09-07 NOTE — MAU Note (Signed)
Pt presents to MAU for pregnancy test. Pt denies any pain of vaginal bleeding.

## 2013-09-07 NOTE — MAU Provider Note (Signed)
Attestation of Attending Supervision of Advanced Practitioner ([name]/NP): Evaluation and management procedures were performed by the Advanced Practitioner under my supervision and collaboration.  I have reviewed the Advanced Practitioner's note and chart, and I agree with the management and plan.  HARRAWAY-SMITH, Georges Victorio 8:12 AM

## 2013-09-11 ENCOUNTER — Encounter (HOSPITAL_COMMUNITY): Payer: Self-pay | Admitting: Emergency Medicine

## 2013-09-11 ENCOUNTER — Emergency Department (HOSPITAL_COMMUNITY)
Admission: EM | Admit: 2013-09-11 | Discharge: 2013-09-11 | Discharge status: Against Medical Advice | Payer: Medicaid Other | Source: Home / Self Care

## 2013-09-11 ENCOUNTER — Emergency Department (HOSPITAL_COMMUNITY)
Admission: EM | Admit: 2013-09-11 | Discharge: 2013-09-11 | Disposition: A | Discharge status: Home / Self Care | Payer: Medicaid Other | Attending: Emergency Medicine | Admitting: Emergency Medicine

## 2013-09-11 DIAGNOSIS — J45909 Unspecified asthma, uncomplicated: Secondary | ICD-10-CM | POA: Insufficient documentation

## 2013-09-11 DIAGNOSIS — Z8619 Personal history of other infectious and parasitic diseases: Secondary | ICD-10-CM | POA: Diagnosis not present

## 2013-09-11 DIAGNOSIS — H5710 Ocular pain, unspecified eye: Secondary | ICD-10-CM | POA: Insufficient documentation

## 2013-09-11 DIAGNOSIS — H109 Unspecified conjunctivitis: Secondary | ICD-10-CM

## 2013-09-11 DIAGNOSIS — IMO0002 Reserved for concepts with insufficient information to code with codable children: Secondary | ICD-10-CM | POA: Insufficient documentation

## 2013-09-11 DIAGNOSIS — H1030 Unspecified acute conjunctivitis, unspecified eye: Secondary | ICD-10-CM | POA: Diagnosis not present

## 2013-09-11 DIAGNOSIS — Z79899 Other long term (current) drug therapy: Secondary | ICD-10-CM | POA: Insufficient documentation

## 2013-09-11 DIAGNOSIS — Z8742 Personal history of other diseases of the female genital tract: Secondary | ICD-10-CM | POA: Insufficient documentation

## 2013-09-11 MED ORDER — ERYTHROMYCIN 5 MG/GM OP OINT
TOPICAL_OINTMENT | Freq: Once | OPHTHALMIC | Status: AC
  Administered 2013-09-11: 20:00:00 via OPHTHALMIC
  Filled 2013-09-11: qty 3.5

## 2013-09-11 MED ORDER — FLUORESCEIN SODIUM 1 MG OP STRP
1.0000 | ORAL_STRIP | Freq: Once | OPHTHALMIC | Status: AC
  Administered 2013-09-11: 1 via OPHTHALMIC
  Filled 2013-09-11: qty 1

## 2013-09-11 MED ORDER — TETRACAINE HCL 0.5 % OP SOLN
2.0000 [drp] | Freq: Once | OPHTHALMIC | Status: AC
  Administered 2013-09-11: 2 [drp] via OPHTHALMIC
  Filled 2013-09-11: qty 2

## 2013-09-11 NOTE — ED Notes (Signed)
Patient with left eye pain.  Patient states that the pain started two nights ago, eye is red, patient states that she had to wipe, crusty pus from left eye.  She states that she has also had some drainage from right eye also.  Patient states that she has not had a URI but her daughter did have a URI and drainage from her eyes last week.

## 2013-09-11 NOTE — ED Provider Notes (Signed)
CSN: [id number]     Arrival date & time 09/11/13  1817 History  This chart was scribed for non-physician practitioner working with Vanetta MuldersScott Zackowski, MD, by Bronson CurbJacqueline Melvin, ED Scribe. This patient was seen in room TR04C/TR04C and the patient's care was started at 7:00 PM.    Chief Complaint  Patient presents with  . Conjunctivitis     The history is provided by the patient. No language interpreter was used.    HPI Comments: Ann Dunn is a 23 y.o. female who presents to the Emergency Department complaining of conjunctivitis in her bilateral eyes onset less than 48 hours ago. Patient states she initially noticed some bilateral eye irritation after exposure to Lysol bathroom cleaner. She reports crusting in her left eye and drainage in her right eye. Patient confirms sick contacts (her 23 year old daughter experienced similar symptoms recently due to an URI). There is also associated redness and itching of her bilateral eyes. She has been using eye drops without significant improvement. She denies blurred vision or any other vision problems. Pt denies wearing contact lens.  Past Medical History  Diagnosis Date  . H/O spontaneous abortion, currently pregnant 11/27/2011    SAB in Feb 2013  . H/O induced abortion 11/27/2011  . Headache(784.0)   . Infection 2010;2011    Yeast x 2  . Infection     BV x 1  . Asthma     Triggered by grass and pollen;inhaler prn  . Vertigo     Not officially dx'd  . Bacterial vaginosis   . PCOS (polycystic ovarian syndrome)   . Active labor at term 07/03/2012   Past Surgical History  Procedure Laterality Date  . Arm wound repair / closure    . Induced abortion     Family History  Problem Relation Age of Onset  . Stroke Maternal Grandmother   . Cancer Maternal Grandmother    History  Substance Use Topics  . Smoking status: Never Smoker   . Smokeless tobacco: Never Used     Comment: marijuana in the past  . Alcohol Use: 0.5 oz/week    1 drink(s)  per week     Comment: dc'd +UPT   OB History   Grav Para Term Preterm Abortions TAB SAB Ect Mult Living   4 1 1  2 1 1   1      Review of Systems  Eyes: Positive for discharge and redness. Negative for pain and itching.      Allergies  Review of patient's allergies indicates no known allergies.  Home Medications   Prior to Admission medications   Medication Sig Start Date End Date Taking? Authorizing Provider  acetaminophen (TYLENOL) 500 MG tablet Take 500 mg by mouth every 6 (six) hours as needed. pain    Historical Provider, MD  ferrous sulfate 325 (65 FE) MG tablet Take 1 tablet (325 mg total) by mouth 2 (two) times daily with a meal. 07/07/12   Haroldine LawsJennifer Oxley, [name]  fluticasone (FLOVENT HFA) 110 MCG/ACT inhaler Inhale 1 puff into the lungs 2 (two) times daily as needed. Wheezing/shortness of breath    Historical Provider, MD  ibuprofen (ADVIL,MOTRIN) 600 MG tablet Take 1 tablet (600 mg total) by mouth every 6 (six) hours. 07/07/12   Haroldine LawsJennifer Oxley, [name]  Prenatal Vit-Fe Fumarate-FA (PRENATAL MULTIVITAMIN) TABS Take 1 tablet by mouth daily. 07/07/12   Haroldine LawsJennifer Oxley, [name]   LMP 08/01/2013  Physical Exam  Nursing note and vitals reviewed. Constitutional: She is oriented  to person, place, and time. She appears well-developed and well-nourished. No distress.  HENT:  Head: Normocephalic and atraumatic.  Eyes: EOM are normal. Pupils are equal, round, and reactive to light. Right eye exhibits discharge. Right eye exhibits no chemosis, no exudate and no hordeolum. No foreign body present in the right eye. Left eye exhibits discharge. Left eye exhibits no chemosis, no exudate and no hordeolum. No foreign body present in the left eye. Right conjunctiva is injected. Right conjunctiva has no hemorrhage. Left conjunctiva is injected. Left conjunctiva has no hemorrhage. No scleral icterus.  Slit lamp exam:      The right eye shows no corneal abrasion, no corneal flare, no corneal ulcer, no foreign  body, no hyphema, no hypopyon and no fluorescein uptake.       The left eye shows no corneal flare, no corneal ulcer, no foreign body, no hyphema, no hypopyon and no fluorescein uptake.  Pusular discharge noted in lateral canthus of right eye. No signs of chemosis.  Neck: Neck supple. No tracheal deviation present.  Cardiovascular: Normal rate.   Pulmonary/Chest: Effort normal. No respiratory distress.  Musculoskeletal: Normal range of motion.  Neurological: She is alert and oriented to person, place, and time.  Skin: Skin is warm and dry.  Psychiatric: She has a normal mood and affect. Her behavior is normal.    ED Course  Procedures (including critical care time)  DIAGNOSTIC STUDIES: Oxygen Saturation is 100% on room air, normal by my interpretation.    COORDINATION OF CARE: At 1905 Discussed treatment plan with patient which includes fluorescein ophthalmic strip and tetracaine ophthalmic solution which allows for in-depth analysis. Patient agrees.   7:44 PM Pt has conjunctivitis of both eyes.  She is currently pregnant.  Will provide erythromycin ophthalmic ointment as treatment.  Eye specialist referral given, along with work note.    Labs Review Labs Reviewed - No data to display  Imaging Review No results found.   EKG Interpretation None      MDM   Final diagnoses:  Bilateral conjunctivitis    09/11/13 0519 98.4 F (36.9 C) 64 16 124/64 mmHg 100 % -- 145 lb (65.772 kg) KMM  I personally performed the services described in this documentation, which was scribed in my presence. The recorded information has been reviewed and is accurate.     Fayrene Helper, PA-C 09/11/13 1945

## 2013-09-11 NOTE — ED Notes (Signed)
Pt reports she was here this am but was not seen quick enough, so she left. Pt presents with bilateral eye redness with drainage. sts they both itch. Denies vision problems

## 2013-09-11 NOTE — ED Notes (Signed)
Declined W/C at D/C and was escorted to lobby by RN. 

## 2013-09-11 NOTE — Discharge Instructions (Signed)
Please apply a ribbon of erythromycin ointment to the lower eyelid 1cm in length up to 6 times daily to both eyes for the next 5-7 days.  Follow up with eye specialist if you notice no improvement in 3 days.    Conjunctivitis Conjunctivitis is commonly called "pink eye." Conjunctivitis can be caused by bacterial or viral infection, allergies, or injuries. There is usually redness of the lining of the eye, itching, discomfort, and sometimes discharge. There may be deposits of matter along the eyelids. A viral infection usually causes a watery discharge, while a bacterial infection causes a yellowish, thick discharge. Pink eye is very contagious and spreads by direct contact. You may be given antibiotic eyedrops as part of your treatment. Before using your eye medicine, remove all drainage from the eye by washing gently with warm water and cotton balls. Continue to use the medication until you have awakened 2 mornings in a row without discharge from the eye. Do not rub your eye. This increases the irritation and helps spread infection. Use separate towels from other household members. Wash your hands with soap and water before and after touching your eyes. Use cold compresses to reduce pain and sunglasses to relieve irritation from light. Do not wear contact lenses or wear eye makeup until the infection is gone. SEEK MEDICAL CARE IF:   Your symptoms are not better after 3 days of treatment.  You have increased pain or trouble seeing.  The outer eyelids become very red or swollen. Document Released: 03/16/2004 Document Revised: 05/01/2011 Document Reviewed: 02/06/2005 [facility] Patient Information 2015 [place], [place]. This information is not intended to replace advice given to you by your health care provider. Make sure you discuss any questions you have with your health care provider.

## 2013-09-11 NOTE — ED Notes (Signed)
Pt was not seen by one of the providers and did not inform staff that she was leaving.  Pt was alert and oriented at triage.

## 2013-09-12 NOTE — ED Provider Notes (Signed)
Medical screening examination/treatment/procedure(s) were performed by non-physician practitioner and as supervising physician I was immediately available for consultation/collaboration.   EKG Interpretation None        Cypress Fanfan, MD 09/12/13 0142 

## 2013-09-16 ENCOUNTER — Inpatient Hospital Stay (HOSPITAL_COMMUNITY)
Admission: AD | Admit: 2013-09-16 | Discharge: 2013-09-17 | Disposition: A | Discharge status: Home / Self Care | Payer: Medicaid Other | Source: Ambulatory Visit | Attending: Obstetrics & Gynecology | Admitting: Obstetrics & Gynecology

## 2013-09-16 ENCOUNTER — Encounter (HOSPITAL_COMMUNITY): Payer: Self-pay | Admitting: *Deleted

## 2013-09-16 DIAGNOSIS — O9989 Other specified diseases and conditions complicating pregnancy, childbirth and the puerperium: Principal | ICD-10-CM

## 2013-09-16 DIAGNOSIS — O99891 Other specified diseases and conditions complicating pregnancy: Secondary | ICD-10-CM | POA: Insufficient documentation

## 2013-09-16 DIAGNOSIS — R109 Unspecified abdominal pain: Secondary | ICD-10-CM | POA: Diagnosis present

## 2013-09-16 DIAGNOSIS — Z87891 Personal history of nicotine dependence: Secondary | ICD-10-CM | POA: Diagnosis not present

## 2013-09-16 DIAGNOSIS — O26891 Other specified pregnancy related conditions, first trimester: Secondary | ICD-10-CM

## 2013-09-16 DIAGNOSIS — N949 Unspecified condition associated with female genital organs and menstrual cycle: Secondary | ICD-10-CM | POA: Insufficient documentation

## 2013-09-16 DIAGNOSIS — R102 Pelvic and perineal pain: Secondary | ICD-10-CM

## 2013-09-16 LAB — URINALYSIS, ROUTINE W REFLEX MICROSCOPIC
Bilirubin Urine: NEGATIVE
GLUCOSE, UA: NEGATIVE mg/dL
Hgb urine dipstick: NEGATIVE
KETONES UR: 15 mg/dL — AB
Nitrite: NEGATIVE
PH: 6 (ref 5.0–8.0)
Protein, ur: NEGATIVE mg/dL
Specific Gravity, Urine: 1.01 (ref 1.005–1.030)
Urobilinogen, UA: 0.2 mg/dL (ref 0.0–1.0)

## 2013-09-16 LAB — CBC
HEMATOCRIT: 38.5 % (ref 36.0–46.0)
HEMOGLOBIN: 13.1 g/dL (ref 12.0–15.0)
MCH: 27.7 pg (ref 26.0–34.0)
MCHC: 34 g/dL (ref 30.0–36.0)
MCV: 81.4 fL (ref 78.0–100.0)
Platelets: 262 10*3/uL (ref 150–400)
RBC: 4.73 MIL/uL (ref 3.87–5.11)
RDW: 13.7 % (ref 11.5–15.5)
WBC: 14.1 10*3/uL — AB (ref 4.0–10.5)

## 2013-09-16 LAB — URINE MICROSCOPIC-ADD ON

## 2013-09-16 LAB — HCG, QUANTITATIVE, PREGNANCY: hCG, Beta Chain, Quant, S: 23347 m[IU]/mL — ABNORMAL HIGH (ref <5)

## 2013-09-16 NOTE — MAU Note (Signed)
Pt reports abd pain and cramping x 3 days, denies bleeding. States she had a miscarriage 2 years ago and she is just worried.

## 2013-09-16 NOTE — MAU Provider Note (Signed)
History     CSN: [id number]  Arrival date and time: 09/16/13 1933   First Provider Initiated Contact with Patient 09/16/13 2045      Chief Complaint  Patient presents with  . Abdominal Pain   Abdominal Pain    Ann Dunn is a 23 y.o. [id number] at [redacted]w[redacted]d who presents today with cramping and lower abdominal pain. She states that the pain started in the evening on Sunday. She states that the pain is a constant pressure feeling that she rates a 5/10. She has not tried anything to relieve the pain at this time. She has had a BV and yeast infections in the past.   Past Medical History  Diagnosis Date  . H/O spontaneous abortion, currently pregnant 11/27/2011    SAB in Feb 2013  . H/O induced abortion 11/27/2011  . Headache(784.0)   . Infection 2010;2011    Yeast x 2  . Infection     BV x 1  . Asthma     Triggered by grass and pollen;inhaler prn  . Vertigo     Not officially dx'd  . Bacterial vaginosis   . PCOS (polycystic ovarian syndrome)   . Active labor at term 07/03/2012    Past Surgical History  Procedure Laterality Date  . Arm wound repair / closure    . Induced abortion      Family History  Problem Relation Age of Onset  . Stroke Maternal Grandmother   . Cancer Maternal Grandmother     History  Substance Use Topics  . Smoking status: Former Smoker -- 0.00 packs/day  . Smokeless tobacco: Never Used     Comment: marijuana in the past  . Alcohol Use: 0.5 oz/week    1 drink(s) per week     Comment: dc'd +UPT    Allergies: No Known Allergies  Prescriptions prior to admission  Medication Sig Dispense Refill  . Prenatal Vit-Fe Fumarate-FA (PRENATAL MULTIVITAMIN) TABS Take 1 tablet by mouth daily.  30 tablet  1    Review of Systems  Gastrointestinal: Positive for abdominal pain.   Physical Exam   Blood pressure 122/64, pulse 66, temperature 99.4 F (37.4 C), temperature source Oral, resp. rate 16, height 5' (1.524 m), weight 65.318 kg (144 lb), last  menstrual period 08/01/2013, SpO2 100.00%, unknown if currently breastfeeding.  Physical Exam  Nursing note and vitals reviewed. Constitutional: She is oriented to person, place, and time. She appears well-developed and well-nourished. No distress.  Cardiovascular: Normal rate.   Respiratory: Effort normal.  GI: Soft. There is no tenderness. There is no rebound.  Neurological: She is alert and oriented to person, place, and time.  Skin: Skin is warm and dry.  Psychiatric: She has a normal mood and affect.    MAU Course  Procedures  Results for orders placed during the hospital encounter of 09/16/13 (from the past 24 hour(s))  URINALYSIS, ROUTINE W REFLEX MICROSCOPIC     Status: Abnormal   Collection Time    09/16/13  7:51 PM      Result Value Ref Range   Color, Urine YELLOW  YELLOW   APPearance CLEAR  CLEAR   Specific Gravity, Urine 1.010  1.005 - 1.030   pH 6.0  5.0 - 8.0   Glucose, UA NEGATIVE  NEGATIVE mg/dL   Hgb urine dipstick NEGATIVE  NEGATIVE   Bilirubin Urine NEGATIVE  NEGATIVE   Ketones, ur 15 (*) NEGATIVE mg/dL   Protein, ur NEGATIVE  NEGATIVE mg/dL   Urobilinogen,  UA 0.2  0.0 - 1.0 mg/dL   Nitrite NEGATIVE  NEGATIVE   Leukocytes, UA SMALL (*) NEGATIVE  URINE MICROSCOPIC-ADD ON     Status: Abnormal   Collection Time    09/16/13  7:51 PM      Result Value Ref Range   Squamous Epithelial / LPF FEW (*) RARE   WBC, UA 3-6  <3 WBC/hpf   RBC / HPF 0-2  <3 RBC/hpf   Bacteria, UA FEW (*) RARE  CBC     Status: Abnormal   Collection Time    09/16/13  8:06 PM      Result Value Ref Range   WBC 14.1 (*) 4.0 - 10.5 K/uL   RBC 4.73  3.87 - 5.11 MIL/uL   Hemoglobin 13.1  12.0 - 15.0 g/dL   HCT 40.9  81.1 - 91.4 %   MCV 81.4  78.0 - 100.0 fL   MCH 27.7  26.0 - 34.0 pg   MCHC 34.0  30.0 - 36.0 g/dL   RDW 78.2  95.6 - 21.3 %   Platelets 262  150 - 400 K/uL  HCG, QUANTITATIVE, PREGNANCY     Status: Abnormal   Collection Time    09/16/13  8:06 PM      Result Value Ref  Range   hCG, Beta Chain, Sharene Butters, [place] [address] (*) <5 mIU/mL   US Ob Comp Less 14 Wks  09/17/2013   CLINICAL DATA:  Early pregnancy with cramping for 3 days. Unsure of LMP. Estimated gestational age by reported LMP is 6 weeks 5 days. Quantitative beta HCG is 23,347. History of prior miscarriages.  EXAM: OBSTETRIC <14 WK [place] AND TRANSVAGINAL OB US  TECHNIQUE: Both transabdominal and transvaginal ultrasound examinations were performed for complete evaluation of the gestation as well as the maternal uterus, adnexal regions, and pelvic cul-de-sac. Transvaginal technique was performed to assess early pregnancy.  COMPARISON:  None.  FINDINGS: Intrauterine gestational sac: Visualized/normal in shape.  Yolk sac:  Present.  Embryo:  Present.  Cardiac Activity: Visualized.  Heart Rate:  94 bpm  CRL:   3.4  mm   6 w 0 d                  [place] EDC: 05/13/2014  Maternal uterus/adnexae: The uterus is anteverted. No myometrial mass lesions demonstrated. There is evidence of a small subchorionic hemorrhage. Both ovaries are visualized with evidence of corpus luteal cyst on the left. No abnormal adnexal masses. No free pelvic fluid collections.  IMPRESSION: Single intrauterine pregnancy. Estimated gestational age by crown-rump length is 6 weeks 0 days. Small subchorionic hemorrhage is noted.   Electronically Signed   By: Burman Nieves M.D.   On: 09/17/2013 00:49   US Ob Transvaginal  09/17/2013   CLINICAL DATA:  Early pregnancy with cramping for 3 days. Unsure of LMP. Estimated gestational age by reported LMP is 6 weeks 5 days. Quantitative beta HCG is 23,347. History of prior miscarriages.  EXAM: OBSTETRIC <14 WK [place] AND TRANSVAGINAL OB US  TECHNIQUE: Both transabdominal and transvaginal ultrasound examinations were performed for complete evaluation of the gestation as well as the maternal uterus, adnexal regions, and pelvic cul-de-sac. Transvaginal technique was performed to assess early pregnancy.  COMPARISON:  None.  FINDINGS:  Intrauterine gestational sac: Visualized/normal in shape.  Yolk sac:  Present.  Embryo:  Present.  Cardiac Activity: Visualized.  Heart Rate:  94 bpm  CRL:   3.4  mm   6 w 0 d  US EDC: 05/13/2014  Maternal uterus/adnexae: The uterus is anteverted. No myometrial mass lesions demonstrated. There is evidence of a small subchorionic hemorrhage. Both ovaries are visualized with evidence of corpus luteal cyst on the left. No abnormal adnexal masses. No free pelvic fluid collections.  IMPRESSION: Single intrauterine pregnancy. Estimated gestational age by crown-rump length is 6 weeks 0 days. Small subchorionic hemorrhage is noted.   Electronically Signed   By: Burman NievesWilliam  Stevens M.D.   On: 09/17/2013 00:49     Assessment and Plan   1. Pelvic pain affecting pregnancy in first trimester, antepartum    Bleeding precautions  Return to MAU as needed [facility] as possible.   Tawnya CrookHogan, Heather Donovan 09/16/2013, 9:42 PM

## 2013-09-17 ENCOUNTER — Inpatient Hospital Stay (HOSPITAL_COMMUNITY): Payer: Medicaid Other

## 2013-09-17 DIAGNOSIS — N949 Unspecified condition associated with female genital organs and menstrual cycle: Secondary | ICD-10-CM

## 2013-09-17 DIAGNOSIS — O9989 Other specified diseases and conditions complicating pregnancy, childbirth and the puerperium: Secondary | ICD-10-CM

## 2013-09-17 NOTE — Discharge Instructions (Signed)
First Trimester of Pregnancy The first trimester of pregnancy is from week 1 until the end of week 12 (months 1 through 3). A week after a sperm fertilizes an egg, the egg will implant on the wall of the uterus. This embryo will begin to develop into a baby. Genes from you and your partner are forming the baby. The female genes determine whether the baby is a boy or a girl. At 6-8 weeks, the eyes and face are formed, and the heartbeat can be seen on ultrasound. At the end of 12 weeks, all the baby's organs are formed.  Now that you are pregnant, you will want to do everything you can to have a healthy baby. Two of the most important things are to get good prenatal care and to follow your health care provider's instructions. Prenatal care is all the medical care you receive before the baby's birth. This care will help prevent, find, and treat any problems during the pregnancy and childbirth. BODY CHANGES Your body goes through many changes during pregnancy. The changes vary from woman to woman.   You may gain or lose a couple of pounds at first.  You may feel sick to your stomach (nauseous) and throw up (vomit). If the vomiting is uncontrollable, call your health care provider.  You may tire easily.  You may develop headaches that can be relieved by medicines approved by your health care provider.  You may urinate more often. Painful urination may mean you have a bladder infection.  You may develop heartburn as a result of your pregnancy.  You may develop constipation because certain hormones are causing the muscles that push waste through your intestines to slow down.  You may develop hemorrhoids or swollen, bulging veins (varicose veins).  Your breasts may begin to grow larger and become tender. Your nipples may stick out more, and the tissue that surrounds them (areola) may become darker.  Your gums may bleed and may be sensitive to brushing and flossing.  Dark spots or blotches (chloasma,  mask of pregnancy) may develop on your face. This will likely fade after the baby is born.  Your menstrual periods will stop.  You may have a loss of appetite.  You may develop cravings for certain kinds of food.  You may have changes in your emotions from day to day, such as being excited to be pregnant or being concerned that something may go wrong with the pregnancy and baby.  You may have more vivid and strange dreams.  You may have changes in your hair. These can include thickening of your hair, rapid growth, and changes in texture. Some women also have hair loss during or after pregnancy, or hair that feels dry or thin. Your hair will most likely return to normal after your baby is born. WHAT TO EXPECT AT YOUR PRENATAL VISITS During a routine prenatal visit:  You will be weighed to make sure you and the baby are growing normally.  Your blood pressure will be taken.  Your abdomen will be measured to track your baby's growth.  The fetal heartbeat will be listened to starting around week 10 or 12 of your pregnancy.  Test results from any previous visits will be discussed. Your health care provider may ask you:  How you are feeling.  If you are feeling the baby move.  If you have had any abnormal symptoms, such as leaking fluid, bleeding, severe headaches, or abdominal cramping.  If you have any questions. Other tests   that may be performed during your first trimester include:  Blood tests to find your blood type and to check for the presence of any previous infections. They will also be used to check for low iron levels (anemia) and Rh antibodies. Later in the pregnancy, blood tests for diabetes will be done along with other tests if problems develop.  Urine tests to check for infections, diabetes, or protein in the urine.  An ultrasound to confirm the proper growth and development of the baby.  An amniocentesis to check for possible genetic problems.  Fetal screens for  spina bifida and Down syndrome.  You may need other tests to make sure you and the baby are doing well. HOME CARE INSTRUCTIONS  Medicines  Follow your health care provider's instructions regarding medicine use. Specific medicines may be either safe or unsafe to take during pregnancy.  Take your prenatal vitamins as directed.  If you develop constipation, try taking a stool softener if your health care provider approves. Diet  Eat regular, well-balanced meals. Choose a variety of foods, such as meat or vegetable-based protein, fish, milk and low-fat dairy products, vegetables, fruits, and whole grain breads and cereals. Your health care provider will help you determine the amount of weight gain that is right for you.  Avoid raw meat and uncooked cheese. These carry germs that can cause birth defects in the baby.  Eating four or five small meals rather than three large meals a day may help relieve nausea and vomiting. If you start to feel nauseous, eating a few soda crackers can be helpful. Drinking liquids between meals instead of during meals also seems to help nausea and vomiting.  If you develop constipation, eat more high-fiber foods, such as fresh vegetables or fruit and whole grains. Drink enough fluids to keep your urine clear or pale yellow. Activity and Exercise  Exercise only as directed by your health care provider. Exercising will help you:  Control your weight.  Stay in shape.  Be prepared for labor and delivery.  Experiencing pain or cramping in the lower abdomen or low back is a good sign that you should stop exercising. Check with your health care provider before continuing normal exercises.  Try to avoid standing for long periods of time. Move your legs often if you must stand in one place for a long time.  Avoid heavy lifting.  Wear low-heeled shoes, and practice good posture.  You may continue to have sex unless your health care provider directs you  otherwise. Relief of Pain or Discomfort  Wear a good support bra for breast tenderness.   Take warm sitz baths to soothe any pain or discomfort caused by hemorrhoids. Use hemorrhoid cream if your health care provider approves.   Rest with your legs elevated if you have leg cramps or low back pain.  If you develop varicose veins in your legs, wear support hose. Elevate your feet for 15 minutes, 3-4 times a day. Limit salt in your diet. Prenatal Care  Schedule your prenatal visits by the twelfth week of pregnancy. They are usually scheduled monthly at first, then more often in the last 2 months before delivery.  Write down your questions. Take them to your prenatal visits.  Keep all your prenatal visits as directed by your health care provider. Safety  Wear your seat belt at all times when driving.  Make a list of emergency phone numbers, including numbers for family, friends, the hospital, and police and fire departments. General Tips    Ask your health care provider for a referral to a local prenatal education class. Begin classes no later than at the beginning of month 6 of your pregnancy.  Ask for help if you have counseling or nutritional needs during pregnancy. Your health care provider can offer advice or refer you to specialists for help with various needs.  Do not use hot tubs, steam rooms, or saunas.  Do not douche or use tampons or scented sanitary pads.  Do not cross your legs for long periods of time.  Avoid cat litter boxes and soil used by cats. These carry germs that can cause birth defects in the baby and possibly loss of the fetus by miscarriage or stillbirth.  Avoid all smoking, herbs, alcohol, and medicines not prescribed by your health care provider. Chemicals in these affect the formation and growth of the baby.  Schedule a dentist appointment. At home, brush your teeth with a soft toothbrush and be gentle when you floss. SEEK MEDICAL CARE IF:   You have  dizziness.  You have mild pelvic cramps, pelvic pressure, or nagging pain in the abdominal area.  You have persistent nausea, vomiting, or diarrhea.  You have a bad smelling vaginal discharge.  You have pain with urination.  You notice increased swelling in your face, hands, legs, or ankles. SEEK IMMEDIATE MEDICAL CARE IF:   You have a fever.  You are leaking fluid from your vagina.  You have spotting or bleeding from your vagina.  You have severe abdominal cramping or pain.  You have rapid weight gain or loss.  You vomit blood or material that looks like coffee grounds.  You are exposed to German measles and have never had them.  You are exposed to fifth disease or chickenpox.  You develop a severe headache.  You have shortness of breath.  You have any kind of trauma, such as from a fall or a car accident. Document Released: 01/31/2001 Document Revised: 06/23/2013 Document Reviewed: 12/17/2012 Exit[employer] Patient Information 2015 [employer]. This information is not intended to replace advice given to you by your health care provider. Make sure you discuss any questions you have with your health care provider.  

## 2013-09-17 NOTE — MAU Note (Signed)
Pt in lobby but complains to MAU admitting that she has been waiting and she is hungry.  Pt in to Triage , explained that we had a lot of u/s's and that each one takes at least 30- 45 minutes. Pt states she was told that she was 4th in line 3 hours ago and she is hungry and we won't let her have anything to eat. Explained that we can't feed her until we know that everything is okay but that they are ready for another pt in u/s at this time and we will take her if she will wait. Pt states she will wait. Pt to u/s via w/c.

## 2013-09-23 LAB — OB RESULTS CONSOLE RPR: RPR: NONREACTIVE

## 2013-09-23 LAB — OB RESULTS CONSOLE GC/CHLAMYDIA
CHLAMYDIA, DNA PROBE: NEGATIVE
Gonorrhea: NEGATIVE

## 2013-09-23 LAB — OB RESULTS CONSOLE ABO/RH: RH Type: POSITIVE

## 2013-09-23 LAB — OB RESULTS CONSOLE HEPATITIS B SURFACE ANTIGEN: Hepatitis B Surface Ag: NEGATIVE

## 2013-09-23 LAB — OB RESULTS CONSOLE HIV ANTIBODY (ROUTINE TESTING): HIV: NONREACTIVE

## 2013-09-23 LAB — OB RESULTS CONSOLE RUBELLA ANTIBODY, IGM: RUBELLA: IMMUNE

## 2013-10-30 ENCOUNTER — Inpatient Hospital Stay (HOSPITAL_COMMUNITY)
Admission: AD | Admit: 2013-10-30 | Discharge: 2013-10-30 | Disposition: A | Discharge status: Home / Self Care | Payer: Medicaid Other | Source: Ambulatory Visit | Attending: Obstetrics and Gynecology | Admitting: Obstetrics and Gynecology

## 2013-10-30 ENCOUNTER — Encounter (HOSPITAL_COMMUNITY): Payer: Self-pay | Admitting: *Deleted

## 2013-10-30 DIAGNOSIS — O210 Mild hyperemesis gravidarum: Secondary | ICD-10-CM | POA: Diagnosis not present

## 2013-10-30 LAB — URINALYSIS, ROUTINE W REFLEX MICROSCOPIC
Bilirubin Urine: NEGATIVE
Glucose, UA: NEGATIVE mg/dL
Hgb urine dipstick: NEGATIVE
KETONES UR: NEGATIVE mg/dL
NITRITE: NEGATIVE
PROTEIN: NEGATIVE mg/dL
Specific Gravity, Urine: 1.015 (ref 1.005–1.030)
UROBILINOGEN UA: 0.2 mg/dL (ref 0.0–1.0)
pH: 7.5 (ref 5.0–8.0)

## 2013-10-30 LAB — URINE MICROSCOPIC-ADD ON

## 2013-10-30 NOTE — MAU Note (Signed)
Pt. States that she has morning sickness throughout the day and gets better in the evening.  She was able to eat some grilled chicken and greens tonight while in the lobby and has kept it down.  She also went to the office yesterday and received som Diclegis samples but didn't take them because she wants to wait and see if she can handle the nausea without medication.

## 2013-10-30 NOTE — MAU Note (Signed)
Pt. Came to front desk and stated that her husband texted her and her daughter has a fever and she needs to go right away.  Pt. Signed AMA form and left the unit.

## 2013-10-30 NOTE — MAU Note (Signed)
Patient states she has been vomiting for a while but has not been able to keep anything down today. Patient has been eating chicken when called to triage and has kept it down so far. Some pain with vomiting, no bleeding or discharge.

## 2013-12-22 ENCOUNTER — Encounter (HOSPITAL_COMMUNITY): Payer: Self-pay | Admitting: *Deleted

## 2014-02-11 ENCOUNTER — Encounter (HOSPITAL_COMMUNITY): Payer: Self-pay | Admitting: *Deleted

## 2014-02-11 ENCOUNTER — Inpatient Hospital Stay (HOSPITAL_COMMUNITY)
Admission: AD | Admit: 2014-02-11 | Discharge: 2014-02-12 | Disposition: A | Discharge status: Home / Self Care | Payer: Medicaid Other | Source: Ambulatory Visit | Attending: Obstetrics & Gynecology | Admitting: Obstetrics & Gynecology

## 2014-02-11 DIAGNOSIS — O219 Vomiting of pregnancy, unspecified: Secondary | ICD-10-CM | POA: Diagnosis present

## 2014-02-11 DIAGNOSIS — Z3A27 27 weeks gestation of pregnancy: Secondary | ICD-10-CM | POA: Diagnosis not present

## 2014-02-11 DIAGNOSIS — Z87891 Personal history of nicotine dependence: Secondary | ICD-10-CM | POA: Diagnosis not present

## 2014-02-11 DIAGNOSIS — O211 Hyperemesis gravidarum with metabolic disturbance: Secondary | ICD-10-CM | POA: Insufficient documentation

## 2014-02-11 DIAGNOSIS — O210 Mild hyperemesis gravidarum: Secondary | ICD-10-CM | POA: Diagnosis present

## 2014-02-11 DIAGNOSIS — O368120 Decreased fetal movements, second trimester, not applicable or unspecified: Secondary | ICD-10-CM | POA: Diagnosis present

## 2014-02-11 LAB — URINALYSIS, ROUTINE W REFLEX MICROSCOPIC
BILIRUBIN URINE: NEGATIVE
Glucose, UA: NEGATIVE mg/dL
HGB URINE DIPSTICK: NEGATIVE
Ketones, ur: NEGATIVE mg/dL
Leukocytes, UA: NEGATIVE
Nitrite: NEGATIVE
PH: 6 (ref 5.0–8.0)
Protein, ur: NEGATIVE mg/dL
SPECIFIC GRAVITY, URINE: 1.015 (ref 1.005–1.030)
Urobilinogen, UA: 1 mg/dL (ref 0.0–1.0)

## 2014-02-11 LAB — COMPREHENSIVE METABOLIC PANEL
ALBUMIN: 3.4 g/dL — AB (ref 3.5–5.2)
ALT: 19 U/L (ref 0–35)
AST: 25 U/L (ref 0–37)
Alkaline Phosphatase: 69 U/L (ref 39–117)
Anion gap: 6 (ref 5–15)
BUN: 7 mg/dL (ref 6–23)
CALCIUM: 8.6 mg/dL (ref 8.4–10.5)
CO2: 25 mmol/L (ref 19–32)
Chloride: 101 mEq/L (ref 96–112)
Creatinine, Ser: 0.33 mg/dL — ABNORMAL LOW (ref 0.50–1.10)
GFR calc Af Amer: >90 mL/min (ref >90)
GFR calc non Af Amer: >90 mL/min (ref >90)
Glucose, Bld: 96 mg/dL (ref 70–99)
POTASSIUM: 2.9 mmol/L — AB (ref 3.5–5.1)
SODIUM: 132 mmol/L — AB (ref 135–145)
TOTAL PROTEIN: 6.9 g/dL (ref 6.0–8.3)
Total Bilirubin: 1 mg/dL (ref 0.3–1.2)

## 2014-02-11 LAB — CBC WITH DIFFERENTIAL/PLATELET
BASOS PCT: 0 % (ref 0–1)
Basophils Absolute: 0 10*3/uL (ref 0.0–0.1)
EOS ABS: 0 10*3/uL (ref 0.0–0.7)
EOS PCT: 0 % (ref 0–5)
HCT: 33.7 % — ABNORMAL LOW (ref 36.0–46.0)
Hemoglobin: 11.1 g/dL — ABNORMAL LOW (ref 12.0–15.0)
Lymphocytes Relative: 7 % — ABNORMAL LOW (ref 12–46)
Lymphs Abs: 0.7 10*3/uL (ref 0.7–4.0)
MCH: 26.8 pg (ref 26.0–34.0)
MCHC: 32.9 g/dL (ref 30.0–36.0)
MCV: 81.4 fL (ref 78.0–100.0)
Monocytes Absolute: 0.7 10*3/uL (ref 0.1–1.0)
Monocytes Relative: 8 % (ref 3–12)
NEUTROS PCT: 85 % — AB (ref 43–77)
Neutro Abs: 7.6 10*3/uL (ref 1.7–7.7)
PLATELETS: 189 10*3/uL (ref 150–400)
RBC: 4.14 MIL/uL (ref 3.87–5.11)
RDW: 14.4 % (ref 11.5–15.5)
WBC: 9 10*3/uL (ref 4.0–10.5)

## 2014-02-11 LAB — AMYLASE: Amylase: 79 U/L (ref 0–105)

## 2014-02-11 LAB — LIPASE, BLOOD: LIPASE: 19 U/L (ref 11–59)

## 2014-02-11 MED ORDER — ONDANSETRON HCL 4 MG/2ML IJ SOLN
4.0000 mg | Freq: Once | INTRAMUSCULAR | Status: AC
  Administered 2014-02-11: 4 mg via INTRAVENOUS
  Filled 2014-02-11: qty 2

## 2014-02-11 MED ORDER — LACTATED RINGERS IV BOLUS (SEPSIS)
500.0000 mL | Freq: Once | INTRAVENOUS | Status: AC
  Administered 2014-02-11: 500 mL via INTRAVENOUS

## 2014-02-11 MED ORDER — LACTATED RINGERS IV SOLN: INTRAVENOUS | Status: DC

## 2014-02-11 NOTE — MAU Note (Signed)
Pt reports she did the glucola test in the office yesterday and has been vomiting since. States she has not been able to eat anything since 12 noon yesterday. Also reports she had an episode of vomiting at 1730 and has not felt the baby move since.

## 2014-02-11 NOTE — MAU Provider Note (Signed)
History   23 yo G4P1021 at 4327 6/7 weeks presented after calling with 24 hours of vomiting since taking glucola yesterday.  Has been unable to keep f/f down.  Was not able to leave work until now.  Denies leaking, bleeding, cramping, fever, or viral exposures.  Reports +FM.  Patient Active Problem List   Diagnosis Date Noted  . Nausea and vomiting during pregnancy 02/11/2014  . H/O induced abortion 11/27/2011  . Asthma 11/15/2011    Chief Complaint  Patient presents with  . Decreased Fetal Movement  . Emesis   HPI:  See above  OB History    Gravida Para Term Preterm AB TAB SAB Ectopic Multiple Living   4 1 1  2 1 1   1       Past Medical History  Diagnosis Date  . H/O spontaneous abortion, currently pregnant 11/27/2011    SAB in Feb 2013  . H/O induced abortion 11/27/2011  . Headache(784.0)   . Infection 2010;2011    Yeast x 2  . Infection     BV x 1  . Asthma     Triggered by grass and pollen;inhaler prn  . Vertigo     Not officially dx'd  . Bacterial vaginosis   . PCOS (polycystic ovarian syndrome)   . Active labor at term 07/03/2012    Past Surgical History  Procedure Laterality Date  . Arm wound repair / closure    . Induced abortion      Family History  Problem Relation Age of Onset  . Stroke Maternal Grandmother   . Cancer Maternal Grandmother     History  Substance Use Topics  . Smoking status: Former Smoker -- 0.00 packs/day    Types: Cigarettes    Quit date: 08/13/2011  . Smokeless tobacco: Never Used     Comment: marijuana in the past  . Alcohol Use: 0.5 oz/week    1 Not specified per week     Comment: dc'd +UPT    Allergies: No Known Allergies  No prescriptions prior to admission    ROS:  N/V today, +FM Physical Exam   Blood pressure 119/66, pulse 91, temperature 97.6 F (36.4 C), temperature source Oral, resp. rate 16, height 5' (1.524 m), weight 171 lb (77.565 kg), last menstrual period 08/01/2013, SpO2 99 %, unknown if currently  breastfeeding.  Physical Exam  In NAD Chest clear Heart RRR without murmur Abd gravid, NT Pelvic--deferred Ext WNL  FHR Category 1 UCs none  Results for orders placed or performed during the hospital encounter of 02/11/14 (from the past 24 hour(s))  Urinalysis, Routine w reflex microscopic     Status: None   Collection Time: 02/11/14 10:17 PM  Result Value Ref Range   Color, Urine YELLOW YELLOW   APPearance CLEAR CLEAR   Specific Gravity, Urine 1.015 1.005 - 1.030   pH 6.0 5.0 - 8.0   Glucose, UA NEGATIVE NEGATIVE mg/dL   Hgb urine dipstick NEGATIVE NEGATIVE   Bilirubin Urine NEGATIVE NEGATIVE   Ketones, ur NEGATIVE NEGATIVE mg/dL   Protein, ur NEGATIVE NEGATIVE mg/dL   Urobilinogen, UA 1.0 0.0 - 1.0 mg/dL   Nitrite NEGATIVE NEGATIVE   Leukocytes, UA NEGATIVE NEGATIVE  CBC with Differential     Status: Abnormal   Collection Time: 02/11/14 10:34 PM  Result Value Ref Range   WBC 9.0 4.0 - 10.5 K/uL   RBC 4.14 3.87 - 5.11 MIL/uL   Hemoglobin 11.1 (L) 12.0 - 15.0 g/dL   HCT 13.233.7 (  L) 36.0 - 46.0 %   MCV 81.4 78.0 - 100.0 fL   MCH 26.8 26.0 - 34.0 pg   MCHC 32.9 30.0 - 36.0 g/dL   RDW 60.614.4 30.111.5 - 60.115.5 %   Platelets 189 150 - 400 K/uL   Neutrophils Relative % 85 (H) 43 - 77 %   Neutro Abs 7.6 1.7 - 7.7 K/uL   Lymphocytes Relative 7 (L) 12 - 46 %   Lymphs Abs 0.7 0.7 - 4.0 K/uL   Monocytes Relative 8 3 - 12 %   Monocytes Absolute 0.7 0.1 - 1.0 K/uL   Eosinophils Relative 0 0 - 5 %   Eosinophils Absolute 0.0 0.0 - 0.7 K/uL   Basophils Relative 0 0 - 1 %   Basophils Absolute 0.0 0.0 - 0.1 K/uL  Comprehensive metabolic panel     Status: Abnormal   Collection Time: 02/11/14 10:34 PM  Result Value Ref Range   Sodium 132 (L) 135 - 145 mmol/L   Potassium 2.9 (L) 3.5 - 5.1 mmol/L   Chloride 101 96 - 112 mEq/L   CO2 25 19 - 32 mmol/L   Glucose, Bld 96 70 - 99 mg/dL   BUN 7 6 - 23 mg/dL   Creatinine, Ser 0.930.33 (L) 0.50 - 1.10 mg/dL   Calcium 8.6 8.4 - 23.510.5 mg/dL   Total  Protein 6.9 6.0 - 8.3 g/dL   Albumin 3.4 (L) 3.5 - 5.2 g/dL   AST 25 0 - 37 U/L   ALT 19 0 - 35 U/L   Alkaline Phosphatase 69 39 - 117 U/L   Total Bilirubin 1.0 0.3 - 1.2 mg/dL   GFR calc non Af Amer >90 >90 mL/min   GFR calc Af Amer >90 >90 mL/min   Anion gap 6 5 - 15  Amylase     Status: None   Collection Time: 02/11/14 10:34 PM  Result Value Ref Range   Amylase 79 0 - 105 U/L  Lipase, blood     Status: None   Collection Time: 02/11/14 10:34 PM  Result Value Ref Range   Lipase 19 11 - 59 U/L     ED Course  Assessment: IUP at 27 6/7 weeks N/V s/p glucola yesterday Hypokalemia  Plan: IV hydration Zofran Will consult Dr. Estanislado Pandyivard regarding hypokalemia.   Nigel BridgemanLATHAM, Kimiah Hibner [name], [name] 02/12/2014 10:18p  Addendum: Feeling much better after IV hydration and Zofran. Now tolerating po fluids. Per consult with Dr. Estanislado Pandyivard, will defer K+ since patient now tolerating fluids--can self correct with resumption of normal intake. Rx Zofran 4 mg ODT, #20, no refills. Keep scheduled appt at [facility] 1/5, call prn. Declines note for work tomorrow.  Nigel BridgemanVicki Druscilla Petsch, [name] 02/11/14 11p

## 2014-02-12 MED ORDER — ONDANSETRON HCL 4 MG PO TABS: 4.0000 mg | ORAL_TABLET | Freq: Three times a day (TID) | ORAL | Status: DC | PRN

## 2014-02-12 NOTE — Discharge Instructions (Signed)

## 2014-02-20 NOTE — L&D Delivery Note (Signed)
Delivery Note At 4:58 PM a viable female, "Ann Dunn", was delivered via Vaginal, Spontaneous Delivery (Presentation: LOA ;  ).  APGAR: 7, 9; weight  .   Placenta status: Intact, Spontaneous.  Cord: 3 vessels with the following complications: CAN x 1, loosely.  Cord pH: NA Loose nuchal cord noted x 1, reduced over shoulders at delivery.  Anesthesia: Epidural Local  Episiotomy: None Lacerations: 1st degree;Labial Suture Repair: 3.0 vicryl Est. Blood Loss (mL): 300  Mom to postpartum.  Baby to Couplet care / Skin to Skin. Patient wants BTL, but has not signed any papers.  Will have patient sign them at office at earliest convenience.  Nigel BridgemanLATHAM, Ellyson Rarick 05/03/2014, 5:26 PM

## 2014-04-14 LAB — OB RESULTS CONSOLE GBS: GBS: NEGATIVE

## 2014-05-02 ENCOUNTER — Inpatient Hospital Stay (HOSPITAL_COMMUNITY)
Admission: AD | Admit: 2014-05-02 | Discharge: 2014-05-05 | DRG: 775 | Disposition: A | Discharge status: Home / Self Care | Payer: Medicaid Other | Source: Ambulatory Visit | Attending: Obstetrics and Gynecology | Admitting: Obstetrics and Gynecology

## 2014-05-02 DIAGNOSIS — O4200 Premature rupture of membranes, onset of labor within 24 hours of rupture, unspecified weeks of gestation: Secondary | ICD-10-CM | POA: Diagnosis present

## 2014-05-02 DIAGNOSIS — Z87891 Personal history of nicotine dependence: Secondary | ICD-10-CM

## 2014-05-02 DIAGNOSIS — Z3A39 39 weeks gestation of pregnancy: Secondary | ICD-10-CM | POA: Diagnosis present

## 2014-05-02 NOTE — MAU Note (Addendum)
Contractions all day. Leaking fld for past 1.5hrs. Clear fld. Had membranes stripped yesterday. 2cm yesterday

## 2014-05-03 ENCOUNTER — Inpatient Hospital Stay (HOSPITAL_COMMUNITY): Payer: Medicaid Other | Admitting: Anesthesiology

## 2014-05-03 ENCOUNTER — Encounter (HOSPITAL_COMMUNITY): Payer: Self-pay

## 2014-05-03 DIAGNOSIS — Z87891 Personal history of nicotine dependence: Secondary | ICD-10-CM | POA: Diagnosis not present

## 2014-05-03 DIAGNOSIS — Z3A39 39 weeks gestation of pregnancy: Secondary | ICD-10-CM | POA: Diagnosis present

## 2014-05-03 DIAGNOSIS — O4200 Premature rupture of membranes, onset of labor within 24 hours of rupture, unspecified weeks of gestation: Secondary | ICD-10-CM | POA: Diagnosis present

## 2014-05-03 LAB — CBC
HEMATOCRIT: 34.5 % — AB (ref 36.0–46.0)
Hemoglobin: 11.1 g/dL — ABNORMAL LOW (ref 12.0–15.0)
MCH: 26.1 pg (ref 26.0–34.0)
MCHC: 32.2 g/dL (ref 30.0–36.0)
MCV: 81 fL (ref 78.0–100.0)
PLATELETS: 197 10*3/uL (ref 150–400)
RBC: 4.26 MIL/uL (ref 3.87–5.11)
RDW: 16 % — AB (ref 11.5–15.5)
WBC: 12 10*3/uL — ABNORMAL HIGH (ref 4.0–10.5)

## 2014-05-03 LAB — TYPE AND SCREEN
ABO/RH(D): O POS
ANTIBODY SCREEN: NEGATIVE

## 2014-05-03 LAB — RPR: RPR: NONREACTIVE

## 2014-05-03 LAB — HIV ANTIBODY (ROUTINE TESTING W REFLEX): HIV Screen 4th Generation wRfx: NONREACTIVE

## 2014-05-03 LAB — POCT FERN TEST: POCT FERN TEST: POSITIVE

## 2014-05-03 MED ORDER — NALBUPHINE HCL 10 MG/ML IJ SOLN: 10.0000 mg | INTRAMUSCULAR | Status: DC | PRN

## 2014-05-03 MED ORDER — PHENYLEPHRINE 40 MCG/ML (10ML) SYRINGE FOR IV PUSH (FOR BLOOD PRESSURE SUPPORT)
80.0000 ug | PREFILLED_SYRINGE | INTRAVENOUS | Status: DC | PRN
  Filled 2014-05-03: qty 2

## 2014-05-03 MED ORDER — WITCH HAZEL-GLYCERIN EX PADS: 1.0000 "application " | MEDICATED_PAD | CUTANEOUS | Status: DC | PRN

## 2014-05-03 MED ORDER — FENTANYL 2.5 MCG/ML BUPIVACAINE 1/10 % EPIDURAL INFUSION (WH - ANES)
INTRAMUSCULAR | Status: DC | PRN
  Administered 2014-05-03: 14 mL/h via EPIDURAL

## 2014-05-03 MED ORDER — IBUPROFEN 600 MG PO TABS
600.0000 mg | ORAL_TABLET | Freq: Four times a day (QID) | ORAL | Status: DC
  Administered 2014-05-04 – 2014-05-05 (×7): 600 mg via ORAL
  Filled 2014-05-03 (×7): qty 1

## 2014-05-03 MED ORDER — SENNOSIDES-DOCUSATE SODIUM 8.6-50 MG PO TABS
2.0000 | ORAL_TABLET | ORAL | Status: DC
  Administered 2014-05-04 – 2014-05-05 (×2): 2 via ORAL
  Filled 2014-05-03 (×2): qty 2

## 2014-05-03 MED ORDER — OXYCODONE-ACETAMINOPHEN 5-325 MG PO TABS: 1.0000 | ORAL_TABLET | ORAL | Status: DC | PRN

## 2014-05-03 MED ORDER — TERBUTALINE SULFATE 1 MG/ML IJ SOLN
0.2500 mg | Freq: Once | INTRAMUSCULAR | Status: DC | PRN
  Filled 2014-05-03: qty 1

## 2014-05-03 MED ORDER — FENTANYL 2.5 MCG/ML BUPIVACAINE 1/10 % EPIDURAL INFUSION (WH - ANES)
INTRAMUSCULAR | Status: AC
  Administered 2014-05-03: 14 mL/h via EPIDURAL
  Filled 2014-05-03: qty 125

## 2014-05-03 MED ORDER — ONDANSETRON HCL 4 MG/2ML IJ SOLN: 4.0000 mg | INTRAMUSCULAR | Status: DC | PRN

## 2014-05-03 MED ORDER — TETANUS-DIPHTH-ACELL PERTUSSIS 5-2.5-18.5 LF-MCG/0.5 IM SUSP: 0.5000 mL | Freq: Once | INTRAMUSCULAR | Status: DC

## 2014-05-03 MED ORDER — PRENATAL MULTIVITAMIN CH
1.0000 | ORAL_TABLET | Freq: Every day | ORAL | Status: DC
  Administered 2014-05-04 – 2014-05-05 (×2): 1 via ORAL
  Filled 2014-05-03 (×2): qty 1

## 2014-05-03 MED ORDER — LACTATED RINGERS IV SOLN: INTRAVENOUS | Status: DC

## 2014-05-03 MED ORDER — CITRIC ACID-SODIUM CITRATE 334-500 MG/5ML PO SOLN: 30.0000 mL | ORAL | Status: DC | PRN

## 2014-05-03 MED ORDER — FENTANYL 2.5 MCG/ML BUPIVACAINE 1/10 % EPIDURAL INFUSION (WH - ANES)
14.0000 mL/h | INTRAMUSCULAR | Status: DC | PRN
  Administered 2014-05-03 (×2): 14 mL/h via EPIDURAL
  Filled 2014-05-03: qty 125

## 2014-05-03 MED ORDER — LACTATED RINGERS IV SOLN
500.0000 mL | INTRAVENOUS | Status: DC | PRN
  Administered 2014-05-03 (×2): 500 mL via INTRAVENOUS

## 2014-05-03 MED ORDER — OXYTOCIN 40 UNITS IN LACTATED RINGERS INFUSION - SIMPLE MED
1.0000 m[IU]/min | INTRAVENOUS | Status: DC
  Administered 2014-05-03: 2 m[IU]/min via INTRAVENOUS

## 2014-05-03 MED ORDER — EPHEDRINE 5 MG/ML INJ
10.0000 mg | INTRAVENOUS | Status: DC | PRN
  Filled 2014-05-03: qty 2

## 2014-05-03 MED ORDER — LACTATED RINGERS IV SOLN
INTRAVENOUS | Status: DC
  Administered 2014-05-03 (×3): via INTRAVENOUS

## 2014-05-03 MED ORDER — LIDOCAINE HCL (PF) 1 % IJ SOLN
30.0000 mL | INTRAMUSCULAR | Status: AC | PRN
  Administered 2014-05-03: 30 mL via SUBCUTANEOUS
  Filled 2014-05-03: qty 30

## 2014-05-03 MED ORDER — ZOLPIDEM TARTRATE 5 MG PO TABS: 5.0000 mg | ORAL_TABLET | Freq: Every evening | ORAL | Status: DC | PRN

## 2014-05-03 MED ORDER — DIPHENHYDRAMINE HCL 25 MG PO CAPS
25.0000 mg | ORAL_CAPSULE | Freq: Four times a day (QID) | ORAL | Status: DC | PRN
Start: 2014-05-03 — End: 2014-05-05

## 2014-05-03 MED ORDER — PHENYLEPHRINE 40 MCG/ML (10ML) SYRINGE FOR IV PUSH (FOR BLOOD PRESSURE SUPPORT)
PREFILLED_SYRINGE | INTRAVENOUS | Status: AC
  Filled 2014-05-03: qty 20

## 2014-05-03 MED ORDER — DIBUCAINE 1 % RE OINT: 1.0000 "application " | TOPICAL_OINTMENT | RECTAL | Status: DC | PRN

## 2014-05-03 MED ORDER — BENZOCAINE-MENTHOL 20-0.5 % EX AERO
1.0000 "application " | INHALATION_SPRAY | CUTANEOUS | Status: DC | PRN
  Administered 2014-05-03: 1 via TOPICAL

## 2014-05-03 MED ORDER — LACTATED RINGERS IV SOLN
INTRAVENOUS | Status: DC
  Administered 2014-05-03: 14:00:00 via INTRAUTERINE

## 2014-05-03 MED ORDER — SIMETHICONE 80 MG PO CHEW: 80.0000 mg | CHEWABLE_TABLET | ORAL | Status: DC | PRN

## 2014-05-03 MED ORDER — OXYCODONE-ACETAMINOPHEN 5-325 MG PO TABS
1.0000 | ORAL_TABLET | ORAL | Status: DC | PRN
  Administered 2014-05-05: 1 via ORAL
  Filled 2014-05-03: qty 1

## 2014-05-03 MED ORDER — OXYTOCIN BOLUS FROM INFUSION: 500.0000 mL | INTRAVENOUS | Status: DC

## 2014-05-03 MED ORDER — LANOLIN HYDROUS EX OINT: TOPICAL_OINTMENT | CUTANEOUS | Status: DC | PRN

## 2014-05-03 MED ORDER — OXYCODONE-ACETAMINOPHEN 5-325 MG PO TABS: 2.0000 | ORAL_TABLET | ORAL | Status: DC | PRN

## 2014-05-03 MED ORDER — ACETAMINOPHEN 325 MG PO TABS: 650.0000 mg | ORAL_TABLET | ORAL | Status: DC | PRN

## 2014-05-03 MED ORDER — OXYCODONE-ACETAMINOPHEN 5-325 MG PO TABS
2.0000 | ORAL_TABLET | ORAL | Status: DC | PRN
  Administered 2014-05-04 (×3): 2 via ORAL
  Filled 2014-05-03 (×4): qty 2

## 2014-05-03 MED ORDER — ONDANSETRON HCL 4 MG/2ML IJ SOLN
4.0000 mg | Freq: Four times a day (QID) | INTRAMUSCULAR | Status: DC | PRN
  Administered 2014-05-03: 4 mg via INTRAVENOUS
  Filled 2014-05-03: qty 2

## 2014-05-03 MED ORDER — LIDOCAINE HCL (PF) 1 % IJ SOLN
INTRAMUSCULAR | Status: DC | PRN
  Administered 2014-05-03 (×2): 8 mL

## 2014-05-03 MED ORDER — ONDANSETRON HCL 4 MG PO TABS: 4.0000 mg | ORAL_TABLET | ORAL | Status: DC | PRN

## 2014-05-03 MED ORDER — OXYTOCIN 40 UNITS IN LACTATED RINGERS INFUSION - SIMPLE MED
62.5000 mL/h | INTRAVENOUS | Status: DC
  Administered 2014-05-03: 62.5 mL/h via INTRAVENOUS
  Filled 2014-05-03: qty 1000

## 2014-05-03 MED ORDER — DIPHENHYDRAMINE HCL 50 MG/ML IJ SOLN
12.5000 mg | INTRAMUSCULAR | Status: DC | PRN
  Administered 2014-05-03: 12.5 mg via INTRAVENOUS
  Filled 2014-05-03: qty 1

## 2014-05-03 MED ORDER — LACTATED RINGERS IV SOLN
500.0000 mL | Freq: Once | INTRAVENOUS | Status: AC
  Administered 2014-05-03: 500 mL via INTRAVENOUS

## 2014-05-03 NOTE — Anesthesia Procedure Notes (Signed)
Epidural Patient location during procedure: OB Start time: 05/03/2014 7:57 AM End time: 05/03/2014 8:01 AM  Staffing Anesthesiologist: Leilani AbleHATCHETT, Marvin Maenza Performed by: anesthesiologist   Preanesthetic Checklist Completed: patient identified, surgical consent, pre-op evaluation, timeout performed, IV checked, risks and benefits discussed and monitors and equipment checked  Epidural Patient position: sitting Prep: site prepped and draped and DuraPrep Patient monitoring: continuous pulse ox and blood pressure Approach: midline Location: L3-L4 Injection technique: LOR air  Needle:  Needle type: Tuohy  Needle gauge: 17 G Needle length: 9 cm and 9 Needle insertion depth: 5 cm cm Catheter type: closed end flexible Catheter size: 19 Gauge Catheter at skin depth: 10 cm Test dose: negative and Other  Assessment Sensory level: T9 Events: blood not aspirated, injection not painful, no injection resistance, negative IV test and no paresthesia  Additional Notes Reason for block:procedure for pain

## 2014-05-03 NOTE — Progress Notes (Signed)
Ann Dunn MRN: [id number]  Subjective: -Patient continues to report contractions.  States contractions not as frequent.    Objective: BP 120/62 mmHg  Pulse 101  Temp(Src) 97.9 F (36.6 C) (Oral)  Resp 18  Ht 5' (1.524 m)  Wt 193 lb 6.4 oz (87.726 kg)  BMI 37.77 kg/m2  LMP 08/01/2013     FHT: 135 bpm, Mod Var, -Decels, +Accels UC:  Q359min, palpates  SVE:   Dilation: 2 Effacement (%): 50 Station: -2, -3 Exam by:: Ann Dunn, cnm Membranes: SROM at 2300 Pitocin:Initiated  Assessment:  IUP at 39.2wks Cat I  FT  SROM Pitocin Augmentation  Plan: -Start pitocin augmentation -Continue other mgmt as ordered -Report to be given to Ann Dunn, [name]  [facility] CenterEMLY, [facility] ServicesYNN,[name], [name] 05/03/2014, 6:16 AM

## 2014-05-03 NOTE — Progress Notes (Signed)
  Subjective: Breathing with UCs, notes they are stronger now.  Mother at bedside.  Requesting epidural.  Objective: BP 101/76 mmHg  Pulse 80  Temp(Src) 98 F (36.7 C) (Oral)  Resp 16  Ht 5' (1.524 m)  Wt 193 lb 6.4 oz (87.726 kg)  BMI 37.77 kg/m2  LMP 08/01/2013      FHT: Category 1 UC:   regular, every 3-5 minutes, moderate SVE:   Dilation: 2 Effacement (%): 50 Station: -3 Exam by:: Ann Dunn [name] at 6:16a Pitocin at 4 mu/min  Assessment:  IUP at 39 2/7 weeks Early labor, s/p SROM at 11pm GBS negative  Plan: Proceed with epidural. Continue augmentation.  Ann Dunn, Ann Dunn [name] 05/03/2014, 7:38 AM

## 2014-05-03 NOTE — H&P (Signed)
Ann Dunn is a 24 y.o. female, [id number] at 39.2 weeks, presenting for leaking of fluid.  Patient reports she had her membranes stripped on Friday and has been contracting, irregularly, since.  Patient states around 2300 she noted leaking of fluid that was occurring every 30-60 seconds.  Patient reports fluid clear, no VB, and good fetal movement.  Patient reports no issues throughout the pregnancy and desires epidural for pain mgmt.   Patient Active Problem List   Diagnosis Date Noted  . Nausea and vomiting during pregnancy 02/11/2014  . H/O induced abortion 11/27/2011  . Asthma 11/15/2011    History of present pregnancy: Patient entered care at 11.4 weeks.   EDC of 05/08/2014 was established by Definite LMP of 08/01/2013.   Anatomy scan:  19.6 weeks, with normal findings and an posterior placenta.   Additional US evaluations:   -33.4wks for maternal weight gain: EFW 2135g, appropriate interval growth, cervix 4.05cm. Fundal height continues to lag, but normal growth. Significant prenatal events:  Patient with common pregnancy complaints including pelvic pressure/pain, N/V, vaginal spotting, and swelling.  Patient also treated for yeast infection at 34wks   Last evaluation:  05/01/2014 by L. Montez Moritaarter, FNP. Ve: 1/50/-2, FHR 130, BP 100/60, Wt 192lbs  OB History    Gravida Para Term Preterm AB TAB SAB Ectopic Multiple Living   4 1 1  2 1 1   1      Past Medical History  Diagnosis Date  . H/O spontaneous abortion, currently pregnant 11/27/2011    SAB in Feb 2013  . H/O induced abortion 11/27/2011  . Headache(784.0)   . Infection 2010;2011    Yeast x 2  . Infection     BV x 1  . Asthma     Triggered by grass and pollen;inhaler prn  . Vertigo     Not officially dx'd  . Bacterial vaginosis   . PCOS (polycystic ovarian syndrome)   . Active labor at term 07/03/2012   Past Surgical History  Procedure Laterality Date  . Arm wound repair / closure    . Induced abortion     Family  History: family history includes Cancer in her maternal grandmother; Stroke in her maternal grandmother. Social History:  reports that she quit smoking about 2 years ago. Her smoking use included Cigarettes. She smoked 0.00 packs per day. She has never used smokeless tobacco. She reports that she uses illicit drugs (Marijuana) about 14 times per week. She reports that she does not drink alcohol.   Prenatal Transfer Tool  Maternal Diabetes: No Genetic Screening: Normal Maternal Ultrasounds/Referrals: Normal Fetal Ultrasounds or other Referrals:  None Maternal Substance Abuse:  No Significant Maternal Medications:  None Significant Maternal Lab Results: Lab values include: Group B Strep negative    ROS:  +LOF, +FM, -VB, +Ctx  Allergies  Allergen Reactions  . Latex Rash       Blood pressure 144/74, pulse 94, temperature 98 F (36.7 C), resp. rate 20, height 5' (1.524 m), weight 193 lb 6.4 oz (87.726 kg), last menstrual period 08/01/2013, unknown if currently breastfeeding.   Physical Exam General: No Distress Chest: Lungs CTA, Heart RRR Abdomen: Soft, NonTender, AGA Skin: Warm, Dry Pelvic: 2-3/50/-3 Ext: WNL Leopolds: Vertex EFW: 6 1/2 lbs  FHR: 135 bpm, Mod Var, -Decels, +Accels UCs:  Graphs occasionally, palpates mild  Prenatal labs: ABO, Rh: --/--/O POS (03/13 0055)O Positive Antibody: NEG (03/13 0055)Negative Rubella:   Immune RPR: Nonreactive (08/04 0000) NR HBsAg: Negative (08/04 0000) Negative  HIV: Non-reactive (08/04 0000) Negative GBS: Negative (02/23 0000) Sickle cell/Hgb electrophoresis:  Normal Pap:  None-Needs PP GC:  Negative Chlamydia:  Negative Genetic screenings:  Normal Glucola:  Normal Other:  None   Assessment IUP at 39.2wks Cat I FT SROM at 2300 GBS Negative  Plan: Admit to [facility] per consult with Dr. Kathie Rhodes. Rivard Routine Labor and Delivery Orders per [facility] Protocol Discussed possibility of pitocin augmentation including r/b and  process Discussed pain management options including IV and epidural Patient questions and concerns addressed   Phillips Climes, [name] 05/03/2014, 12:29 AM

## 2014-05-03 NOTE — Progress Notes (Addendum)
  Subjective: Comfortable.  Objective: BP 114/67 mmHg  Pulse 79  Temp(Src) 97.6 F (36.4 C) (Oral)  Resp 18  Ht 5' (1.524 m)  Wt 193 lb 6.4 oz (87.726 kg)  BMI 37.77 kg/m2  SpO2 99%  LMP 08/01/2013   Total I/O In: -  Out: 600 [Urine:600]  FHT: Category 2--mild variables with UCs. + scalp stim UC:   irregular, every 1-3 minutes SVE:   Dilation: 8 Effacement (%): 90 Station: -1 Exam by:: Manfred ArchV. Shakhia Gramajo [name] --edematous anterior lip MVUs 220-230 Pitocin at 8 mu/min  Assessment:  Progressive labor Variable decels  Plan: Amnioinfusion Position with peanut to facilitate rotation/descent.  Ann Dunn, Ann Dunn [name] 05/03/2014, 1:53 PM

## 2014-05-03 NOTE — Progress Notes (Signed)
  Subjective: Comfortable with epidural--mild nausea.  Objective: BP 124/75 mmHg  Pulse 82  Temp(Src) 97.7 F (36.5 C) (Oral)  Resp 20  Ht 5' (1.524 m)  Wt 193 lb 6.4 oz (87.726 kg)  BMI 37.77 kg/m2  SpO2 99%  LMP 08/01/2013      FHT: Category 1 UC:   q 3-5 min, moderate SVE:   Dilation: 3 Effacement (%): 90 Station: -3 Exam by:: Ann Dunn [name]   Vtx high, but definitely vtx Pitocin at 8 mu/min  Assessment:  Early labor GBS negative  Plan: Continue pitocin augmentation to achieve/maintain adequacy. Position change to facilitate rotation/descent.  Ann Dunn, Ann Dunn [name] 05/03/2014, 9:21 AM

## 2014-05-03 NOTE — Progress Notes (Signed)
Wynn Maudlinyerra D Mauritz MRN: [id number]  Subjective: -Patient resting in bed.  Reports perception of contractions.  Denies need for pain medication at current.   Objective: BP 136/70 mmHg  Pulse 93  Temp(Src) 97.6 F (36.4 C) (Oral)  Resp 18  Ht 5' (1.524 m)  Wt 193 lb 6.4 oz (87.726 kg)  BMI 37.77 kg/m2  LMP 08/01/2013     FHT: 150 bpm, Mod Var, -Decels, +Accels UC:   Graphs occasionally, palpates moderate SVE:   Deferred Membranes: SROM at 2300  Pitocin: None  Assessment:  IUP at 39.2wks Cat I FT  SROM  Plan: -Will reassess for cervical change in 2-2.5 hours -Patient denies needs at current -Continue other mgmt as ordered  Jennah Satchell LYNN,[name], [name] 05/03/2014, 4:12 AM

## 2014-05-03 NOTE — Progress Notes (Signed)
  Subjective: Comfortable with epidural.  Objective: BP 95/50 mmHg  Pulse 84  Temp(Src) 98.2 F (36.8 C) (Oral)  Resp 16  Ht 5' (1.524 m)  Wt 193 lb 6.4 oz (87.726 kg)  BMI 37.77 kg/m2  SpO2 99%  LMP 08/01/2013   Total I/O In: -  Out: 600 [Urine:600]  FHT: Category 2--mild variables with some UCs UC:   regular, every 2-3 minutes SVE:   Dilation: 8 Effacement (%): 90 Station: -1 Exam by:: Manfred ArchV. Colbi Staubs [name]  --edematous anterior lip Asyclitic presentation MVUs 220-225 Pitocin at 8 mu/min  Assessment:  Progressive labor Asynclitic presentation  Plan: Change position to facilitate rotation/descent.  Nigel BridgemanLATHAM, Keyia Moretto [name] 05/03/2014, 4:10 PM

## 2014-05-03 NOTE — Anesthesia Preprocedure Evaluation (Signed)
Anesthesia Evaluation  Patient identified by MRN, date of birth, ID band Patient awake    Reviewed: Allergy & Precautions, H&P , NPO status , Patient's Chart, lab work & pertinent test results  Airway Mallampati: II TM Distance: >3 FB Neck ROM: full    Dental no notable dental hx.    Pulmonary former smoker,    Pulmonary exam normal       Cardiovascular negative cardio ROS      Neuro/Psych negative psych ROS   GI/Hepatic negative GI ROS, Neg liver ROS,   Endo/Other  Morbid obesity  Renal/GU negative Renal ROS     Musculoskeletal   Abdominal (+) + obese,   Peds  Hematology negative hematology ROS (+)   Anesthesia Other Findings   Reproductive/Obstetrics (+) Pregnancy                           Anesthesia Physical Anesthesia Plan  ASA: III  Anesthesia Plan: Epidural   Post-op Pain Management:    Induction:   Airway Management Planned:   Additional Equipment:   Intra-op Plan:   Post-operative Plan:   Informed Consent: I have reviewed the patients History and Physical, chart, labs and discussed the procedure including the risks, benefits and alternatives for the proposed anesthesia with the patient or authorized representative who has indicated his/her understanding and acceptance.     Plan Discussed with:   Anesthesia Plan Comments:         Anesthesia Quick Evaluation  

## 2014-05-03 NOTE — Progress Notes (Signed)
  Subjective: Feeling some pressure.  Objective: BP 124/63 mmHg  Pulse 79  Temp(Src) 97.6 F (36.4 C) (Oral)  Resp 18  Ht 5' (1.524 m)  Wt 193 lb 6.4 oz (87.726 kg)  BMI 37.77 kg/m2  SpO2 99%  LMP 08/01/2013   Total I/O In: -  Out: 600 [Urine:600]  FHT: Category 2--mild variables with contractions UC:   irregular, every 2-4 minutes SVE:   Dilation: 6 Effacement (%): 90 Station: -2 Exam by:: Manfred ArchV. Madox Corkins [name]  IUPC placed without difficulty Pitocin at 8 mu/min  Assessment:  Progressive labor  Plan: Will continue to observe--continue augmentation.  Ann Dunn, Ann Dunn [name] 05/03/2014, 1:07 PM

## 2014-05-04 LAB — CBC
HCT: 31.7 % — ABNORMAL LOW (ref 36.0–46.0)
Hemoglobin: 10.3 g/dL — ABNORMAL LOW (ref 12.0–15.0)
MCH: 26.3 pg (ref 26.0–34.0)
MCHC: 32.5 g/dL (ref 30.0–36.0)
MCV: 81.1 fL (ref 78.0–100.0)
Platelets: 173 10*3/uL (ref 150–400)
RBC: 3.91 MIL/uL (ref 3.87–5.11)
RDW: 16.1 % — ABNORMAL HIGH (ref 11.5–15.5)
WBC: 19.6 10*3/uL — AB (ref 4.0–10.5)

## 2014-05-04 NOTE — Lactation Note (Signed)
This note was copied from the chart of Ann Starr Lakeyerra Fujii. Lactation Consultation Note  Patient Name: Ann Dunn VWUJW'JToday's Date: 05/04/2014   Gave comfort gels to RN for patient who is c/o nipple pain. LC will follow-up with patient tomorrow.  Lendon KaVann, Arvell Pulsifer Walker 05/04/2014, 11:05 PM

## 2014-05-04 NOTE — Progress Notes (Signed)
Subjective: Postpartum Day 1: Vaginal delivery, right labial laceration Patient up ad lib, reports no syncope or dizziness. Feeding:  Breast Contraceptive plan:  Micronor  Objective: Vital signs in last 24 hours: Temp:  [97.6 F (36.4 C)-98.7 F (37.1 C)] 98.1 F (36.7 C) (03/14 0607) Pulse Rate:  [71-104] 78 (03/14 0607) Resp:  [16-20] 20 (03/14 0607) BP: (95-137)/(50-92) 131/75 mmHg (03/14 [id number])  Physical Exam:  General: alert Lochia: appropriate Uterine Fundus: firm Perineum: healing well DVT Evaluation: No evidence of DVT seen on physical exam. Negative Homan's sign.  CBC Latest Ref Rng 05/04/2014 05/03/2014 02/11/2014  WBC 4.0 - 10.5 K/uL 19.6(H) 12.0(H) 9.0  Hemoglobin 12.0 - 15.0 g/dL 10.3(L) 11.1(L) 11.1(L)  Hematocrit 36.0 - 46.0 % 31.7(L) 34.5(L) 33.7(L)  Platelets 150 - 400 K/uL 173 197 189     Assessment/Plan: Status post vaginal delivery day 1. Stable Continue current care. Plan for discharge tomorrow    Nyra CapesLATHAM, VICKICNM 05/04/2014, 8:39 AM

## 2014-05-04 NOTE — Lactation Note (Signed)
This note was copied from the chart of Ann Dunn Brucker. Lactation Consultation Note Experiencd BF mom for 2-3 months until she went back to work for her 1st child is 2622 months old. Mom has inverted nipples and stated that they come out well and she BF well with her first child. Mom put baby on breast and when baby came off nipple was everted. Asked mom is it felt like the baby had a deep latch and not just on nipple and mom stated yes. Baby had a wide flange on breast.  Hand pump given and on Rt. Breast just a few pumps the nipple pulled out well.  Gave mom shells to wear in bra between BF to assist in everting nipples. Mom stated she worn them last time only during the hospital stay. Asked mom if she used a NS any for BF, she stated yes only during the hospital stay. Encouraged deep latching, discussed milk transfer, pre-pumping prior to latching. Hx' PCOS, has wide space between breast. DEBP encouraged post-pumping to encouraged milk supply. Mom knows to pump q3h for 15-20 min.Mom encouraged to feed baby 8-12 times/24 hours and with feeding cues. Referred to Baby and Me Book in Breastfeeding section Pg. 22-23 for position options and Proper latch demonstration.Mom encouraged to do skin-to-skin. Educated about newborn behavior, mom stated baby keeps popping off and on during BF, explained normal. WH/LC brochure given w/resources, support groups and LC services.Mom encouraged to waken baby for feeds.  Patient Name: Ann Dunn Winzer ZOXWR'UToday's Date: 05/04/2014 Reason for consult: Initial assessment   Maternal Data Has patient been taught Hand Expression?: Yes Does the patient have breastfeeding experience prior to this delivery?: Yes  Feeding Feeding Type: Breast Fed Length of feed: 27 min  LATCH Score/Interventions Latch: Repeated attempts needed to sustain latch, nipple held in mouth throughout feeding, stimulation needed to elicit sucking reflex. Intervention(s): Breast massage;Breast  compression  Audible Swallowing: Spontaneous and intermittent  Type of Nipple: Inverted Intervention(s): Shells;Hand pump Intervention(s): Double electric pump  Comfort (Breast/Nipple): Soft / non-tender     Hold (Positioning): No assistance needed to correctly position infant at breast.  LATCH Score: 7  Lactation Tools Discussed/Used Tools: Shells;Pump Shell Type: Inverted Breast pump type: Double-Electric Breast Pump (manual to use for everting nipples prior to latching) Pump Review: Setup, frequency, and cleaning;Milk Storage Initiated by:: Peri JeffersonL. Malcomb Gangemi RN Date initiated:: 05/04/14   Consult Status Consult Status: Follow-up Date: 05/04/14 (in pm) Follow-up type: In-patient    Charyl DancerCARVER, Denali Becvar G 05/04/2014, 3:29 AM

## 2014-05-04 NOTE — Anesthesia Postprocedure Evaluation (Signed)
Anesthesia Post Note  Patient: Ann Dunn  Procedure(s) Performed: * No procedures listed *  Anesthesia type: Epidural  Patient location: Mother/Baby  Post pain: Pain level controlled  Post assessment: Post-op Vital signs reviewed  Last Vitals:  Filed Vitals:   05/04/14 0607  BP: 131/75  Pulse: 78  Temp: 36.7 C  Resp: 20    Post vital signs: Reviewed  Level of consciousness:alert  Complications: No apparent anesthesia complications

## 2014-05-04 NOTE — Progress Notes (Signed)
UR chart review completed.  

## 2014-05-04 NOTE — Lactation Note (Signed)
This note was copied from the chart of Girl Starr Lakeyerra Livingstone. Lactation Consultation Note: observed mother with infant latched on the Rt breast. Infant observed with intermittent swallows. Mother taught breast compression. Mother denies painful latch . Advised mother to adjust infants lower jaw for wider gape. Mother has large nipple that invert. She is able to pull nipple and firm nipple when latch ing infant. infant latched to Left breast in cradle hold. Observed good depth. Mother advised to breastfeed infant 8-12 times in 24 hours. Advised mother to post pump with DEBP . Mother states that she exclusively breastfed her last infant for 2-3 months. She states that she had lots of milk . Mother is aware of available LC services and community support.   Patient Name: Girl Starr Lakeyerra Swaby ZOXWR'UToday's Date: 05/04/2014 Reason for consult: Follow-up assessment   Maternal Data    Feeding Feeding Type: Breast Fed Length of feed: 25 min  LATCH Score/Interventions Latch: Grasps breast easily, tongue down, lips flanged, rhythmical sucking. Intervention(s): Assist with latch;Adjust position  Audible Swallowing: A few with stimulation  Type of Nipple: Flat  Comfort (Breast/Nipple): Soft / non-tender     Hold (Positioning): No assistance needed to correctly position infant at breast. Intervention(s): Support Pillows;Position options  LATCH Score: 8  Lactation Tools Discussed/Used     Consult Status Consult Status: Follow-up Date: 05/04/14 Follow-up type: In-patient    Stevan BornKendrick, Zygmunt Mcglinn [facility] 05/04/2014, 4:10 PM

## 2014-05-05 MED ORDER — IBUPROFEN 600 MG PO TABS: 600.0000 mg | ORAL_TABLET | Freq: Four times a day (QID) | ORAL | Status: DC | PRN

## 2014-05-05 NOTE — Discharge Summary (Signed)
Vaginal Delivery Discharge Summary  Ann Dunn  DOB:    August 28, 1990 MRN:    [id number] CSN:    [id number]  Date of admission:                  05/02/2014  Date of discharge:                   05/05/2014  Procedures this admission:   SVD with 1st degree and Right Labial Laceration  Date of Delivery: 05/03/2014  Newborn Data:  Live born female  Birth Weight: 7 lb 6.2 oz (3350 g) APGAR: 7, 9  Home with mother. Name: Saryah Circumcision Plan: None  History of Present Illness:  Ann Dunn is a 24 y.o. female, [id number], who presents at [redacted]w[redacted]d weeks gestation. The patient has been followed at the [facility] Gynecology division of [facility]. She was admitted onset of labor and rupture of membranes. Her pregnancy has been complicated by:  Patient Active Problem List   Diagnosis Date Noted  . Obstetric labial laceration, delivered, current hospitalization 05/05/2014  . PROM with onset of labor within 24 hours of rupture 05/03/2014  . Vaginal delivery 05/03/2014  . Nausea and vomiting during pregnancy 02/11/2014  . H/O induced abortion 11/27/2011  . Asthma 11/15/2011     Hospital Course:  Admitted for SROM and contractions. Negative GBS. Progressed with pitocin augmentation. Utilized epidural for pain management.  Delivery was performed by V. Emilee Hero, [name] without complication. Patient and baby tolerated the procedure without difficulty, with  1st degree and labial laceration noted. Infant status was stable and remained in room with mother.  Mother and infant then had an uncomplicated postpartum course, with breast feeding going well. Mom's physical exam was WNL, and she was discharged home in stable condition. Contraception plan was PP BTL.  She received adequate benefit from po pain medications.   Feeding:  breast  Contraception:  bilateral tubal ligation  Discharge hemoglobin:  HEMOGLOBIN  Date Value Ref Range Status   05/04/2014 10.3* 12.0 - 15.0 g/dL Final   HCT  Date Value Ref Range Status  05/04/2014 31.7* 36.0 - 46.0 % Final    Discharge Physical Exam:   General: alert, cooperative and no distress  Chest: Lungs CTA, Heart RRR Breast: Soft, Nipples Intact Abdomen: Soft, Distended Lochia: appropriate Uterine Fundus: firm at umbilicus Incision: None DVT Evaluation: No evidence of DVT seen on physical exam. Negative Homan's sign. No cords or calf tenderness. No significant calf/ankle edema. Skin: Warm, Dry  Intrapartum Procedures: spontaneous vaginal delivery Postpartum Procedures: none Complications-Operative and Postpartum: 1st degree perineal laceration and labial laceration  Discharge Diagnoses: Term Pregnancy-delivered  Discharge Information:  Activity:           pelvic rest Diet:                routine Medications: Ibuprofen Condition:      stable Instructions:  Pain Management, Peri-Care, Breastfeeding, Who and When to call for postpartum complications. Information Sheet(s) given Female Sterilization, PPD & Baby Blues    Discharge to: home  Follow-up Information    Follow up with [facility]. Schedule an appointment as soon as possible for a visit in 6 weeks.   Specialty:  Obstetrics and Gynecology   Why:  Please call if you have any questions or concerns, prior to your next visit.   Contact information:   [address] [place] [address]  [phone]       Marlene BastEMLY, Ariel Wingrove LYNN [name], [name] 05/05/2014 10:16 AM

## 2014-05-05 NOTE — Discharge Instructions (Signed)
Postpartum Depression and Baby Blues °The postpartum period begins right after the birth of a baby. During this time, there is often a great amount of joy and excitement. It is also a time of many changes in the life of the parents. Regardless of how many times a mother gives birth, each child brings new challenges and dynamics to the family. It is not unusual to have feelings of excitement along with confusing shifts in moods, emotions, and thoughts. All mothers are at risk of developing postpartum depression or the "baby blues." These mood changes can occur right after giving birth, or they may occur many months after giving birth. The baby blues or postpartum depression can be mild or severe. Additionally, postpartum depression can go away rather quickly, or it can be a long-term condition.  °CAUSES °Raised hormone levels and the rapid drop in those levels are thought to be a main cause of postpartum depression and the baby blues. A number of hormones change during and after pregnancy. Estrogen and progesterone usually decrease right after the delivery of your baby. The levels of thyroid hormone and various cortisol steroids also rapidly drop. Other factors that play a role in these mood changes include major life events and genetics.  °RISK FACTORS °If you have any of the following risks for the baby blues or postpartum depression, know what symptoms to watch out for during the postpartum period. Risk factors that may increase the likelihood of getting the baby blues or postpartum depression include: °· Having a personal or family history of depression.   °· Having depression while being pregnant.   °· Having premenstrual mood issues or mood issues related to oral contraceptives. °· Having a lot of life stress.   °· Having marital conflict.   °· Lacking a social support network.   °· Having a baby with special needs.   °· Having health problems, such as diabetes.   °SIGNS AND SYMPTOMS °Symptoms of baby blues  include: °· Brief changes in mood, such as going from extreme happiness to sadness. °· Decreased concentration.   °· Difficulty sleeping.   °· Crying spells, tearfulness.   °· Irritability.   °· Anxiety.   °Symptoms of postpartum depression typically begin within the first month after giving birth. These symptoms include: °· Difficulty sleeping or excessive sleepiness.   °· Marked weight loss.   °· Agitation.   °· Feelings of worthlessness.   °· Lack of interest in activity or food.   °Postpartum psychosis is a very serious condition and can be dangerous. Fortunately, it is rare. Displaying any of the following symptoms is cause for immediate medical attention. Symptoms of postpartum psychosis include:  °· Hallucinations and delusions.   °· Bizarre or disorganized behavior.   °· Confusion or disorientation.   °DIAGNOSIS  °A diagnosis is made by an evaluation of your symptoms. There are no medical or lab tests that lead to a diagnosis, but there are various questionnaires that a health care provider may use to identify those with the baby blues, postpartum depression, or psychosis. Often, a screening tool called the Edinburgh Postnatal Depression Scale is used to diagnose depression in the postpartum period.  °TREATMENT °The baby blues usually goes away on its own in 1-2 weeks. Social support is often all that is needed. You will be encouraged to get adequate sleep and rest. Occasionally, you may be given medicines to help you sleep.  °Postpartum depression requires treatment because it can last several months or longer if it is not treated. Treatment may include individual or group therapy, medicine, or both to address any social, physiological, and psychological   factors that may play a role in the depression. Regular exercise, a healthy diet, rest, and social support may also be strongly recommended.  Postpartum psychosis is more serious and needs treatment right away. Hospitalization is often needed. HOME CARE  INSTRUCTIONS  Get as much rest as you can. Nap when the baby sleeps.   Exercise regularly. Some women find yoga and walking to be beneficial.   Eat a balanced and nourishing diet.   Do little things that you enjoy. Have a cup of tea, take a bubble bath, read your favorite magazine, or listen to your favorite music.  Avoid alcohol.   Ask for help with household chores, cooking, grocery shopping, or running errands as needed. Do not try to do everything.   Talk to people close to you about how you are feeling. Get support from your partner, family members, friends, or other new moms.  Try to stay positive in how you think. Think about the things you are grateful for.   Do not spend a lot of time alone.   Only take over-the-counter or prescription medicine as directed by your health care provider.  Keep all your postpartum appointments.   Let your health care provider know if you have any concerns.  SEEK MEDICAL CARE IF: You are having a reaction to or problems with your medicine. SEEK IMMEDIATE MEDICAL CARE IF:  You have suicidal feelings.   You think you may harm the baby or someone else. MAKE SURE YOU:  Understand these instructions.  Will watch your condition.  Will get help right away if you are not doing well or get worse. Document Released: 11/11/2003 Document Revised: 02/11/2013 Document Reviewed: 11/18/2012 [facility] Patient Information 2015 [place], [place]. This information is not intended to replace advice given to you by your health care provider. Make sure you discuss any questions you have with your health care provider. Sterilization Information, Female Female sterilization is a procedure to permanently prevent pregnancy. There are different ways to perform sterilization, but all either block or close the fallopian tubes so that your eggs cannot reach your uterus. If your egg cannot reach your uterus, sperm cannot fertilize the egg, and you cannot get  pregnant.  Sterilization is performed by a surgical procedure. Sometimes these procedures are performed in a hospital while a patient is asleep. Sometimes they can be done in a clinic setting with the patient awake. The fallopian tubes can be surgically cut, tied, or sealed through a procedure called tubal ligation. The fallopian tubes can also be closed with clips or rings. Sterilization can also be done by placing a tiny coil into each fallopian tube, which causes scar tissue to grow inside the tube. The scar tissue then blocks the tubes.  Discuss sterilization with your caregiver to answer any concerns you or your partner may have. You may want to ask what type of sterilization your caregiver performs. Some caregivers may not perform all the various options. Sterilization is permanent and should only be done if you are sure you do not want children or do not want any more children. Having a sterilization reversed may not be successful.  STERILIZATION PROCEDURES  Laparoscopic sterilization. This is a surgical method performed at a time other than right after childbirth. Two incisions are made in the lower abdomen. A thin, lighted tube (laparoscope) is inserted into one of the incisions and is used to perform the procedure. The fallopian tubes are closed with a ring or a clip. An instrument that uses heat  could be used to seal the tubes closed (electrocautery).   Mini-laparotomy. This is a surgical method done 1 or 2 days after giving birth. Typically, a small incision is made just below the belly button (umbilicus) and the fallopian tubes are exposed. The tubes can then be sealed, tied, or cut.   Hysteroscopic sterilization. This is performed at a time other than right after childbirth. A tiny, spring-like coil is inserted through the cervix and uterus and placed into the fallopian tubes. The coil causes scaring and blocks the tubes. Other forms of contraception should be used for 3 months after the  procedure to allow the scar tissue to form completely. Additionally, it is required hysterosalpingography be done 3 months later to ensure that the procedure was successful. Hysterosalpingography is a procedure that uses X-rays to look at your uterus and fallopian tubes after a material to make them show up better has been inserted. IS STERILIZATION SAFE? Sterilization is considered safe with very rare complications. Risks depend on the type of procedure you have. As with any surgical procedure, there are risks. Some risks of sterilization by any means include:   Bleeding.  Infection.  Reaction to anesthesia medicine.  Injury to surrounding organs. Risks specific to having hysteroscopic coils placed include:  The coils may not be placed correctly the first time.   The coils may move out of place.   The tubes may not get completely blocked after 3 months.   Injury to surrounding organs when placing the coil.  HOW EFFECTIVE IS FEMALE STERILIZATION? Sterilization is nearly 100% effective, but it can fail. Depending on the type of sterilization, the rate of failure can be as high as 3%. After hysteroscopic sterilization with placement of fallopian tube coils, you will need back-up birth control for 3 months after the procedure. Sterilization is effective for a lifetime.  BENEFITS OF STERILIZATION  It does not affect your hormones, and therefore will not affect your menstrual periods, sexual desire, or performance.   It is effective for a lifetime.   It is safe.   You do not need to worry about getting pregnant. Keep in mind that if you had the hysteroscopic placement procedure, you must wait 3 months after the procedure (or until your caregiver confirms) before pregnancy is not considered possible.   There are no side effects unlike other types of birth control (contraception).  DRAWBACKS OF STERILIZATION  You must be sure you do not want children or any more children. The  procedure is permanent.   It does not provide protection against sexually transmitted infections (STIs).   The tubes can grow back together. If this happens, there is a risk of pregnancy. There is also an increased risk (50%) of pregnancy being an ectopic pregnancy. This is a pregnancy that happens outside of the uterus. Document Released: 07/26/2007 Document Revised: 02/11/2013 Document Reviewed: 05/25/2011 [facility] Patient Information 2015 [place], [place]. This information is not intended to replace advice given to you by your health care provider. Make sure you discuss any questions you have with your health care provider.

## 2014-05-05 NOTE — Lactation Note (Signed)
This note was copied from the chart of Ann Dunn. Lactation Consultation Note Reviewed supply and demand, engorgement, pumping, formula feeding decreasing milk supply. Mom is mainly bottle feeding w/formula and occasionally puts baby to breast. [facility] Tomorrow to talk with them in getting a pump. Reminded about support groups and resources. Patient Name: Ann Dunn [id number]'s Date: 05/05/2014 Reason for consult: Follow-up assessment   Maternal Data    Feeding    LATCH Score/Interventions                      Lactation Tools Discussed/Used     Consult Status Consult Status: Complete Date: 05/05/14    Charyl DancerCARVER, [name] 05/05/2014, 1:53 PM

## 2015-03-03 ENCOUNTER — Encounter (HOSPITAL_COMMUNITY): Payer: Self-pay | Admitting: Emergency Medicine

## 2015-03-03 ENCOUNTER — Emergency Department (HOSPITAL_COMMUNITY)
Admission: EM | Admit: 2015-03-03 | Discharge: 2015-03-03 | Disposition: A | Discharge status: Home / Self Care | Payer: 59 | Attending: Emergency Medicine | Admitting: Emergency Medicine

## 2015-03-03 DIAGNOSIS — Z8619 Personal history of other infectious and parasitic diseases: Secondary | ICD-10-CM | POA: Insufficient documentation

## 2015-03-03 DIAGNOSIS — J45909 Unspecified asthma, uncomplicated: Secondary | ICD-10-CM | POA: Diagnosis not present

## 2015-03-03 DIAGNOSIS — Z8639 Personal history of other endocrine, nutritional and metabolic disease: Secondary | ICD-10-CM | POA: Insufficient documentation

## 2015-03-03 DIAGNOSIS — Z87891 Personal history of nicotine dependence: Secondary | ICD-10-CM | POA: Diagnosis not present

## 2015-03-03 DIAGNOSIS — J029 Acute pharyngitis, unspecified: Secondary | ICD-10-CM

## 2015-03-03 DIAGNOSIS — Z91040 Latex allergy status: Secondary | ICD-10-CM | POA: Insufficient documentation

## 2015-03-03 NOTE — Discharge Instructions (Signed)

## 2015-03-03 NOTE — ED Notes (Signed)
Pt states for last 2 days she has had a sore throat and a dry cough. 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 pts voice is hoarse.

## 2015-03-03 NOTE — ED Provider Notes (Signed)
CSN: [id number]     Arrival date & time 03/03/15  2114 History  By signing my name below, I, Ronney LionSuzanne Le, attest that this documentation has been prepared under the direction and in the presence of Newell RubbermaidJeffrey Ericha Whittingham, PA-C. Electronically Signed: Ronney LionSuzanne Le, ED Scribe. 03/03/2015. 10:59 PM.    Chief Complaint  Patient presents with  . Sore Throat  . Cough   The history is provided by the patient. No language interpreter was used.    HPI Comments:  Wynn Maudlinyerra D Frayne is a 25 y.o. female with a history of asthma, who presents to the Emergency Department complaining of a gradual-onset, constant, worsening, moderate sore throat that began last night and acutely worsened this morning. She also notes associated voice hoarseness and a dry cough. 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 Swallowing slightly worsens her pain, but she denies any difficulty swallowing. Patient has tried nothing for her symptoms; nothing makes her symptoms better. She denies fever, chills, nausea, or vomiting.  Past Medical History  Diagnosis Date  . H/O spontaneous abortion, currently pregnant 11/27/2011    SAB in Feb 2013  . H/O induced abortion 11/27/2011  . Headache(784.0)   . Infection 2010;2011    Yeast x 2  . Infection     BV x 1  . Asthma     Triggered by grass and pollen;inhaler prn  . Vertigo     Not officially dx'd  . Bacterial vaginosis   . PCOS (polycystic ovarian syndrome)   . Active labor at term 07/03/2012   Past Surgical History  Procedure Laterality Date  . Arm wound repair / closure    . Induced abortion     Family History  Problem Relation Age of Onset  . Stroke Maternal Grandmother   . Cancer Maternal Grandmother    Social History  Substance Use Topics  . Smoking status: Former Smoker -- 0.00 packs/day    Types: Cigarettes    Quit date: 08/13/2011  . Smokeless tobacco: Never Used      Comment: marijuana in the past  . Alcohol Use: No     Comment: 5 years ago- socially   OB History    Gravida Para Term Preterm AB TAB SAB Ectopic Multiple Living   4 2 2  2 1 1   0 2     Review of Systems A complete 10 system review of systems was obtained and all systems are negative except as noted in the HPI and PMH.    Allergies  Latex  Home Medications   Prior to Admission medications   Medication Sig Start Date End Date Taking? Authorizing Provider  ibuprofen (ADVIL,MOTRIN) 600 MG tablet Take 1 tablet (600 mg total) by mouth every 6 (six) hours as needed. Patient not taking: Reported on 03/03/2015 05/05/14   Gerrit HeckJessica Emly, [name]  Prenatal Vit-Fe Fumarate-FA (PRENATAL MULTIVITAMIN) TABS Take 1 tablet by mouth daily. Patient not taking: Reported on 03/03/2015 07/07/12   Haroldine LawsJennifer Oxley, [name]   BP 111/63 mmHg  Pulse 70  Temp(Src) 98.4 F (36.9 C) (Oral)  Resp 16  Ht 5' (1.524 m)  Wt 73.483 kg  BMI 31.64 kg/m2  SpO2 99%  LMP 01/23/2015 Physical Exam  Constitutional: She is oriented to person, place, and time. She appears well-developed and well-nourished. No distress.  HENT:  Head: Normocephalic and atraumatic.  Right Ear: Hearing, tympanic membrane,  external ear and ear canal normal.  Left Ear: Hearing, tympanic membrane, external ear and ear canal normal.  Mouth/Throat: Uvula is midline, oropharynx is clear and moist and mucous membranes are normal. No oropharyngeal exudate, posterior oropharyngeal edema, posterior oropharyngeal erythema or tonsillar abscesses.  Eyes: Conjunctivae and EOM are normal.  Neck: Neck supple. No tracheal deviation present.  Cardiovascular: Normal rate, regular rhythm and normal heart sounds.  Exam reveals no gallop and no friction rub.   No murmur heard. Pulmonary/Chest: Effort normal. No respiratory distress. She has no decreased breath sounds. She has no wheezes. She has no rhonchi. She has no rales.  Lungs are clear to auscultation.    Musculoskeletal: Normal range of motion.  Neurological: She is alert and oriented to person, place, and time.  Skin: Skin is warm and dry.  Psychiatric: She has a normal mood and affect. Her behavior is normal.  Nursing note and vitals reviewed.   ED Course  Procedures (including critical care time)  DIAGNOSTIC STUDIES: Oxygen Saturation is 98% on RA, normal by my interpretation.    COORDINATION OF CARE: 10:25 PM - Discussed treatment plan with pt at bedside which includes work note. Pt verbalized understanding and agreed to plan.   MDM   Final diagnoses:  Pharyngitis    Labs:  Imaging:  Consults:  Therapeutics:  Discharge Meds:   Assessment/Plan:  Pt afebrile without tonsillar exudate, rapid strep screen note done. Presents with mild dysphagia; diagnosis of viral pharyngitis. No abx indicated. Pt does not appear dehydrated, but did discuss importance of water rehydration. Presentation non concerning for PTA or infxn spread to soft tissue. No trismus or uvula deviation. Specific return precautions discussed. Pt able to drink water in ED without difficulty with intact air way. Will give work note. Pt verbalized understanding and agreed to plan.    I personally performed the services described in this documentation, which was scribed in my presence. The recorded information has been reviewed and is accurate.     Eyvonne Mechanic, PA-C 03/03/15 2259  Mancel Bale, MD 03/04/15 [phone]

## 2015-08-05 IMAGING — US US OB TRANSVAGINAL
1 series · 13 of 28 positions shown · non-contrast
Comparison: None.

CLINICAL DATA: Early pregnancy with cramping for 3 days. Unsure of
LMP. Estimated gestational age by reported LMP is 6 weeks 5 days.
Quantitative beta HCG is [DATE]. History of prior miscarriages.

EXAM:
OBSTETRIC <14 WK US AND TRANSVAGINAL OB US
TECHNIQUE: Both transabdominal and transvaginal ultrasound examinations were
performed for complete evaluation of the gestation as well as the
maternal uterus, adnexal regions, and pelvic cul-de-sac.
Transvaginal technique was performed to assess early pregnancy.

[Series 1: us ob comp less 14 wks · 58 acquisitions, 13 frames shown]
[im 3/58]
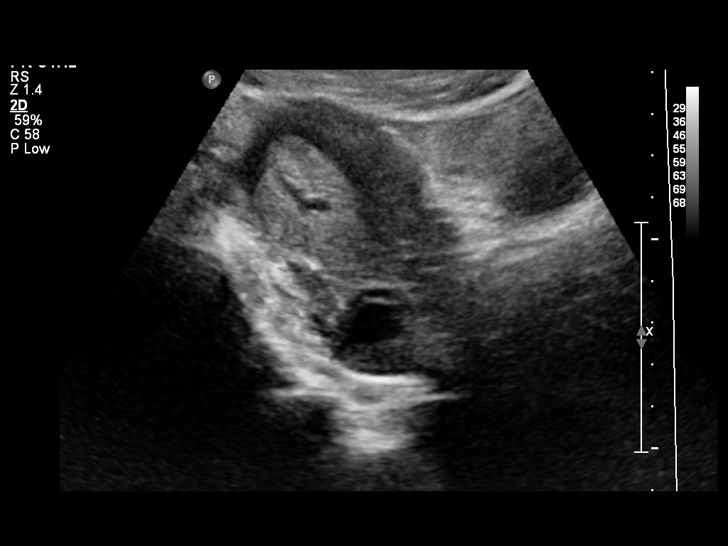
[im 7/58]
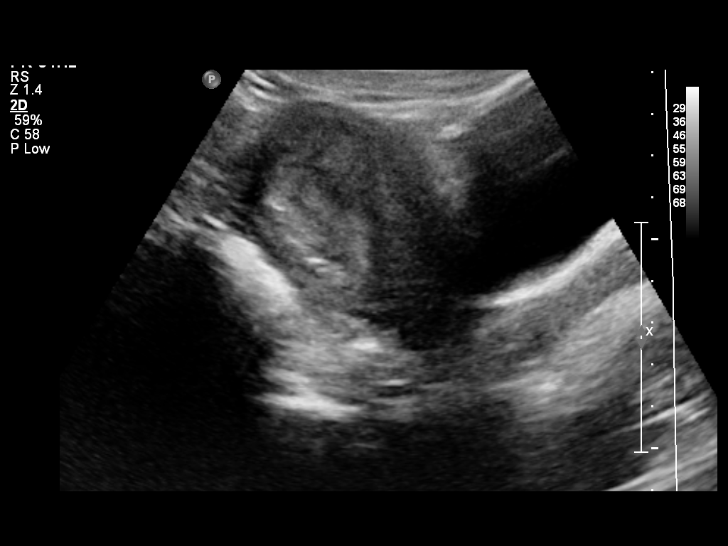
[im 11/58]
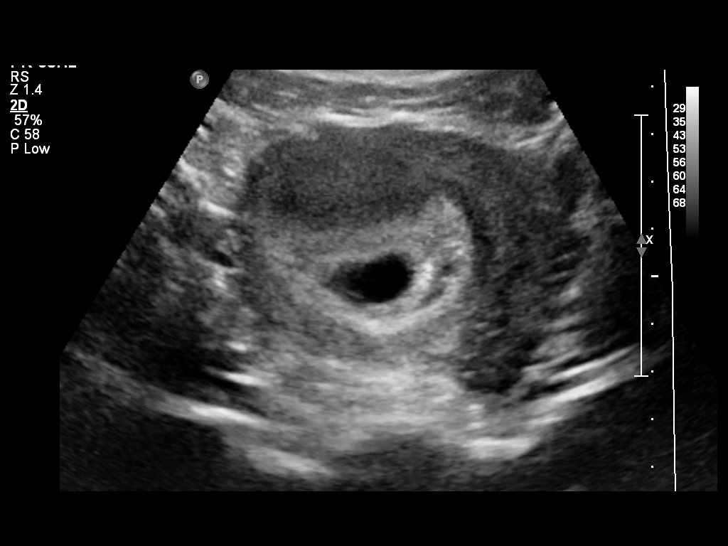
[im 15/58]
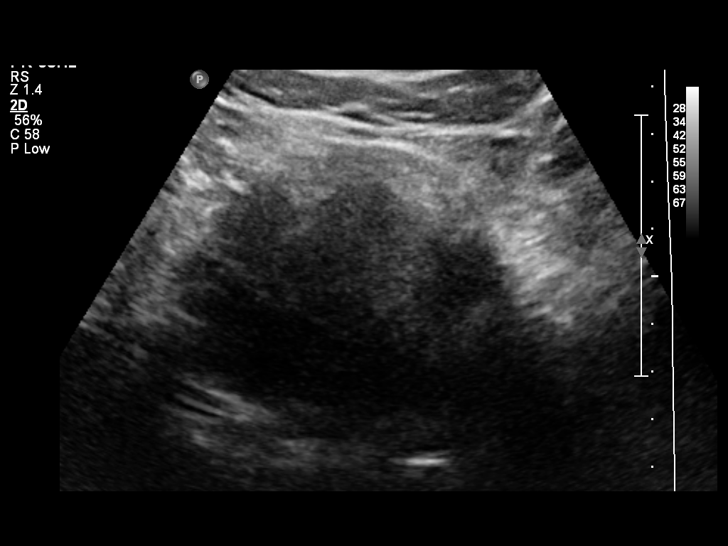
[im 20/58]
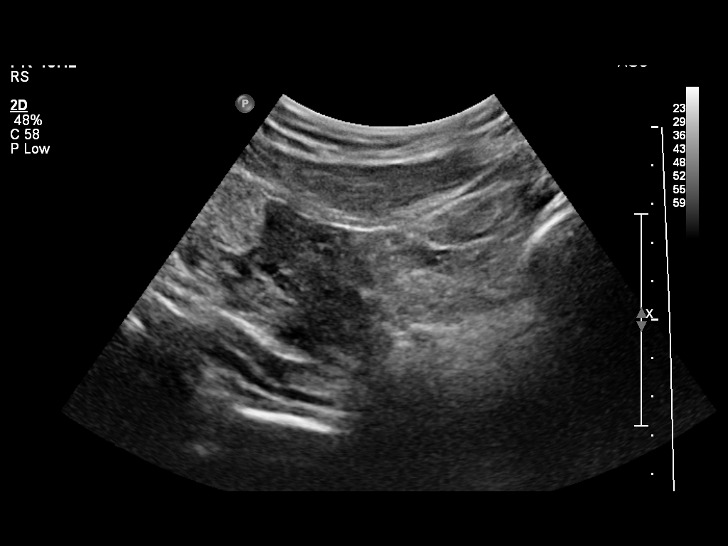
[im 24/58]
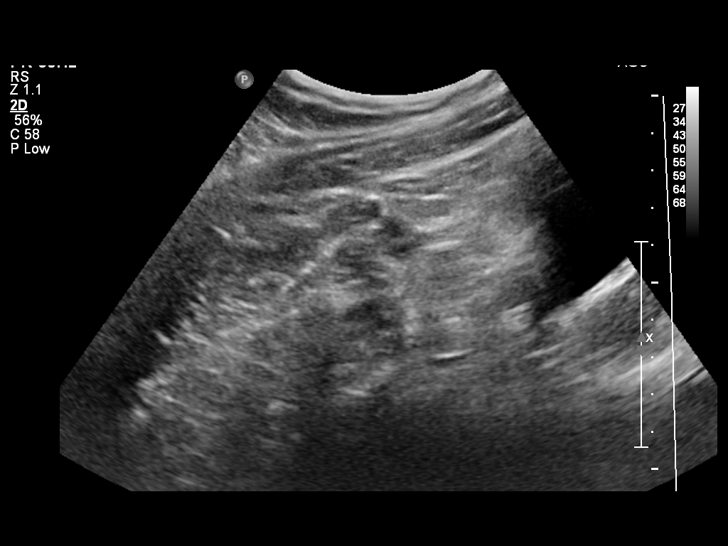
[im 30/58]
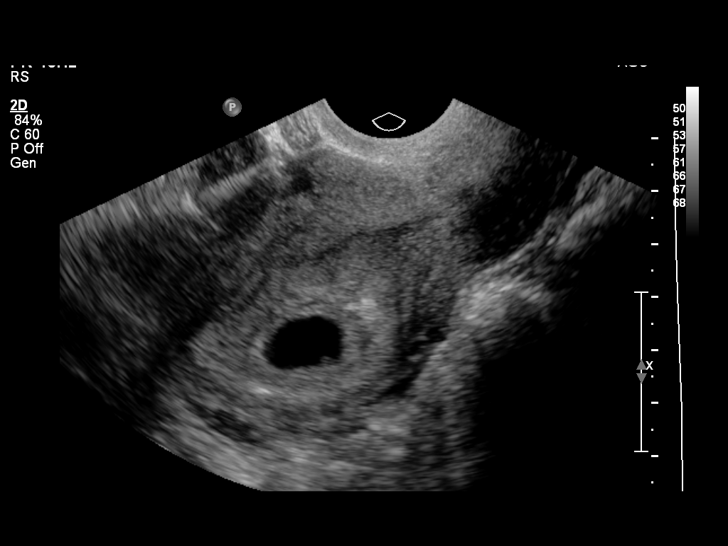
[im 34/58]
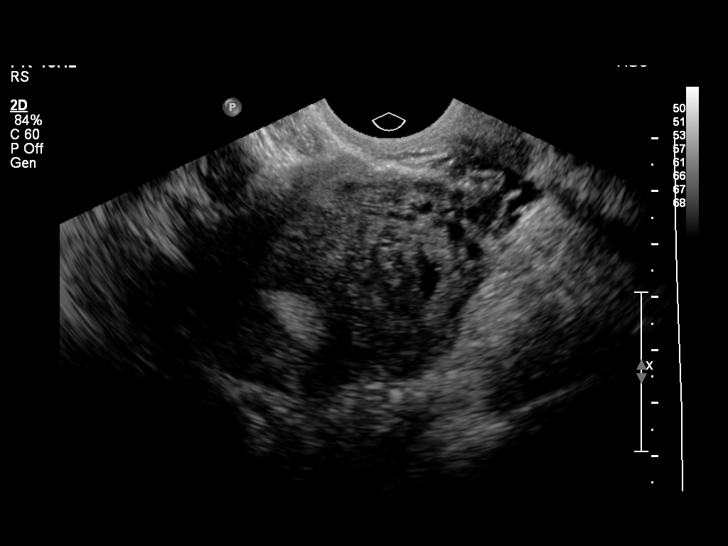
[im 39/58]
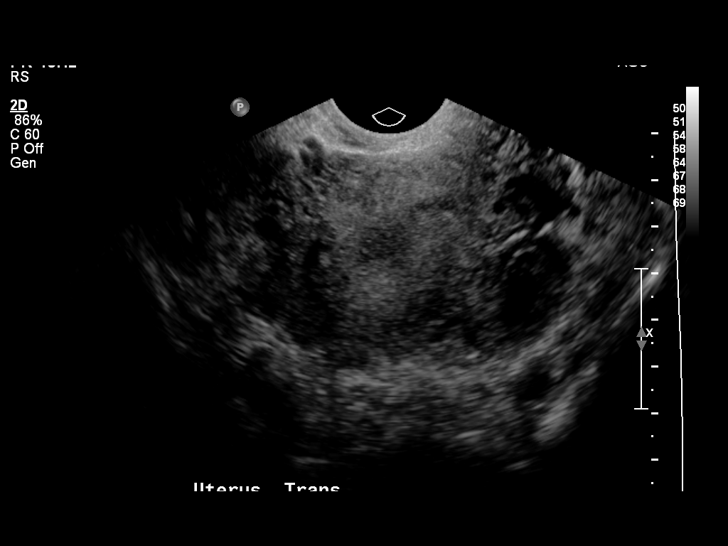
[im 43/58]
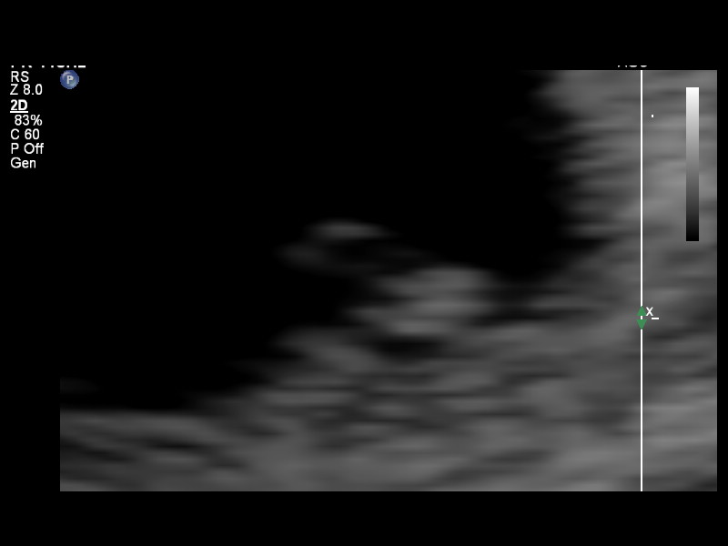
[im 47/58]
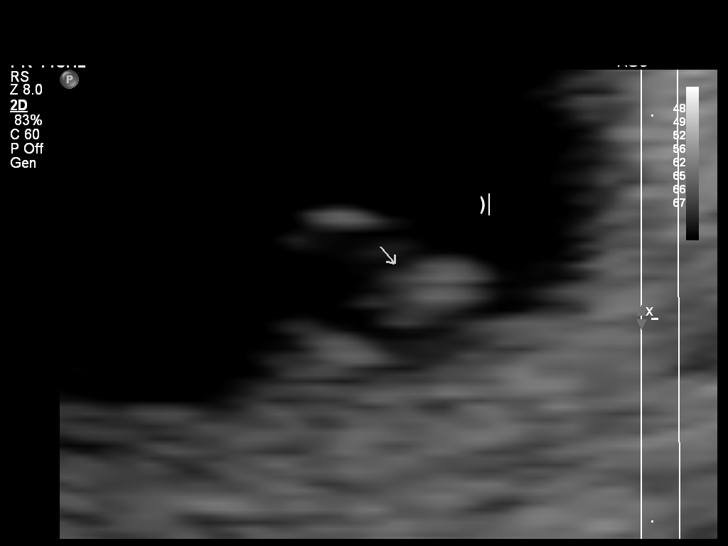
[im 51/58]
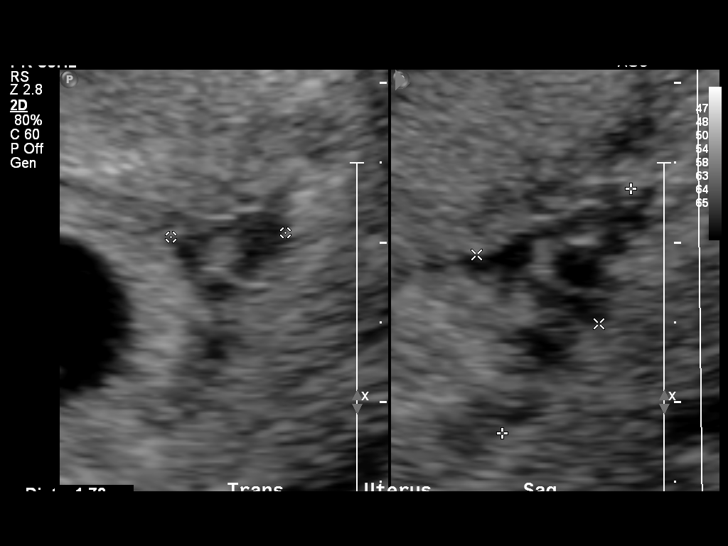
[im 55/58]
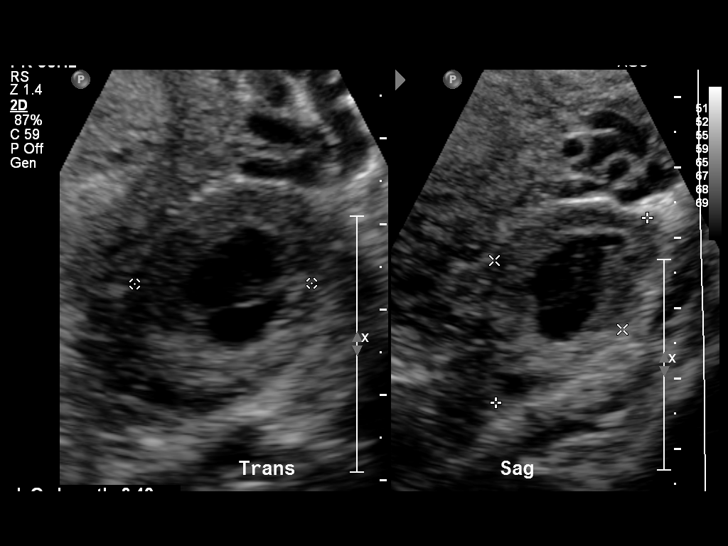

[13 of 28 positions shown; findings below may reference images not displayed]

FINDINGS: Intrauterine gestational sac: Visualized/normal in shape.

Yolk sac:  Present.

Embryo:  Present.

Cardiac Activity: Visualized.

Heart Rate:  94 bpm

CRL:   3.4  mm   6 w 0 d                  US EDC: 05/13/2014

Maternal uterus/adnexae: The uterus is anteverted. No myometrial
mass lesions demonstrated. There is evidence of a small subchorionic
hemorrhage. Both ovaries are visualized with evidence of corpus
luteal cyst on the left. No abnormal adnexal masses. No free pelvic
fluid collections.
IMPRESSION: Single intrauterine pregnancy. Estimated gestational age by
crown-rump length is 6 weeks 0 days. Small subchorionic hemorrhage
is noted.

## 2015-12-08 ENCOUNTER — Encounter (HOSPITAL_COMMUNITY): Payer: Self-pay | Admitting: *Deleted

## 2015-12-08 ENCOUNTER — Encounter: Payer: Self-pay | Admitting: Obstetrics and Gynecology

## 2015-12-08 ENCOUNTER — Ambulatory Visit (INDEPENDENT_AMBULATORY_CARE_PROVIDER_SITE_OTHER): Payer: Medicaid Other | Admitting: *Deleted

## 2015-12-08 ENCOUNTER — Inpatient Hospital Stay (HOSPITAL_COMMUNITY)
Admission: AD | Admit: 2015-12-08 | Discharge: 2015-12-08 | Disposition: A | Discharge status: Home / Self Care | Payer: Medicaid Other | Source: Ambulatory Visit | Attending: Obstetrics and Gynecology | Admitting: Obstetrics and Gynecology

## 2015-12-08 DIAGNOSIS — N912 Amenorrhea, unspecified: Secondary | ICD-10-CM | POA: Insufficient documentation

## 2015-12-08 DIAGNOSIS — Z3200 Encounter for pregnancy test, result unknown: Secondary | ICD-10-CM

## 2015-12-08 DIAGNOSIS — Z3201 Encounter for pregnancy test, result positive: Secondary | ICD-10-CM

## 2015-12-08 LAB — POCT PREGNANCY, URINE: Preg Test, Ur: POSITIVE — AB

## 2015-12-08 NOTE — MAU Note (Signed)
Late on her cycle, last was 9/5. Not had any sickness. Did a home test last night that was +.

## 2015-12-08 NOTE — Progress Notes (Signed)
UPT is positive today. LMP 10/26/15 which yields EDD 08/01/16.  Pt states she has 35 day cycles.

## 2015-12-08 NOTE — MAU Provider Note (Signed)
S:  Ann Dunn is a 25 y.o. female [phone] here in MAU for a pregnancy test. She is without complaints today. She is without pain or vaginal bleeding.   O:  GENERAL: Well-developed, well-nourished female in no acute distress.  LUNGS: Effort normal SKIN: Warm, dry and without erythema PSYCH: Normal mood and affect  Vitals:   12/08/15 1347  BP: 107/64  Pulse: 68  Resp: 16  Temp: 98.5 F (36.9 C)     A:  1. Encounter for pregnancy test, result unknown     P:  Discharge home in stable condition.  Patient without pain or bleeding. Patient instructed to go to the [facility] M-T before 4:30 for a pregnancy test and pregnancy verification.  Discussed proper use of the MAU.   Duane LopeJennifer I Sherene Plancarte, NP 12/08/2015 1:49 PM

## 2015-12-11 ENCOUNTER — Encounter (HOSPITAL_COMMUNITY): Payer: Self-pay

## 2015-12-11 ENCOUNTER — Inpatient Hospital Stay (HOSPITAL_COMMUNITY): Payer: Medicaid Other

## 2015-12-11 ENCOUNTER — Inpatient Hospital Stay (HOSPITAL_COMMUNITY)
Admission: AD | Admit: 2015-12-11 | Discharge: 2015-12-11 | Disposition: A | Discharge status: Home / Self Care | Payer: Medicaid Other | Source: Ambulatory Visit | Attending: Obstetrics & Gynecology | Admitting: Obstetrics & Gynecology

## 2015-12-11 DIAGNOSIS — Z87891 Personal history of nicotine dependence: Secondary | ICD-10-CM | POA: Insufficient documentation

## 2015-12-11 DIAGNOSIS — O26891 Other specified pregnancy related conditions, first trimester: Secondary | ICD-10-CM

## 2015-12-11 DIAGNOSIS — O3481 Maternal care for other abnormalities of pelvic organs, first trimester: Secondary | ICD-10-CM | POA: Diagnosis not present

## 2015-12-11 DIAGNOSIS — O209 Hemorrhage in early pregnancy, unspecified: Secondary | ICD-10-CM | POA: Diagnosis not present

## 2015-12-11 DIAGNOSIS — N8311 Corpus luteum cyst of right ovary: Secondary | ICD-10-CM | POA: Diagnosis not present

## 2015-12-11 DIAGNOSIS — R109 Unspecified abdominal pain: Secondary | ICD-10-CM

## 2015-12-11 DIAGNOSIS — O26899 Other specified pregnancy related conditions, unspecified trimester: Secondary | ICD-10-CM

## 2015-12-11 DIAGNOSIS — Z3A01 Less than 8 weeks gestation of pregnancy: Secondary | ICD-10-CM | POA: Diagnosis not present

## 2015-12-11 LAB — URINALYSIS, ROUTINE W REFLEX MICROSCOPIC
Bilirubin Urine: NEGATIVE
Glucose, UA: NEGATIVE mg/dL
KETONES UR: NEGATIVE mg/dL
LEUKOCYTES UA: NEGATIVE
Nitrite: NEGATIVE
PROTEIN: NEGATIVE mg/dL
Specific Gravity, Urine: >1.03 — ABNORMAL HIGH (ref 1.005–1.030)
pH: 6 (ref 5.0–8.0)

## 2015-12-11 LAB — CBC
HCT: 37.5 % (ref 36.0–46.0)
Hemoglobin: 12.7 g/dL (ref 12.0–15.0)
MCH: 27.4 pg (ref 26.0–34.0)
MCHC: 33.9 g/dL (ref 30.0–36.0)
MCV: 80.8 fL (ref 78.0–100.0)
PLATELETS: 259 10*3/uL (ref 150–400)
RBC: 4.64 MIL/uL (ref 3.87–5.11)
RDW: 14.5 % (ref 11.5–15.5)
WBC: 10.3 10*3/uL (ref 4.0–10.5)

## 2015-12-11 LAB — WET PREP, GENITAL
CLUE CELLS WET PREP: NONE SEEN
SPERM: NONE SEEN
TRICH WET PREP: NONE SEEN
Yeast Wet Prep HPF POC: NONE SEEN

## 2015-12-11 LAB — URINE MICROSCOPIC-ADD ON: RBC / HPF: NONE SEEN RBC/hpf (ref 0–5)

## 2015-12-11 LAB — HCG, QUANTITATIVE, PREGNANCY: HCG, BETA CHAIN, QUANT, S: 5981 m[IU]/mL — AB (ref <5)

## 2015-12-11 NOTE — Discharge Instructions (Signed)
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
## 2015-12-11 NOTE — MAU Note (Signed)
Patient presents with onset of vaginal bleeding at exactly 3:30 pm

## 2015-12-11 NOTE — MAU Note (Signed)
Pt states she has been having LLQ pain & lower back pain for 2 days.  Also has had diarrhea for 2 weeks.  Started spotting today - notes dark red/brown bleeding with wiping.

## 2015-12-12 LAB — HIV ANTIBODY (ROUTINE TESTING W REFLEX): HIV Screen 4th Generation wRfx: NONREACTIVE

## 2015-12-13 LAB — GC/CHLAMYDIA PROBE AMP (~~LOC~~) NOT AT ARMC
CHLAMYDIA, DNA PROBE: NEGATIVE
NEISSERIA GONORRHEA: NEGATIVE

## 2015-12-22 ENCOUNTER — Telehealth: Payer: Self-pay | Admitting: *Deleted

## 2015-12-22 NOTE — Telephone Encounter (Addendum)
Pt left message stating that she was seen @ MAU for spotting on 10/21 and had US showing 6w 4d yolk sac only. She stated that she was told she would need follow up US but has not received a call. Per chart review, pt is correct. US order was placed by Wynelle BourgeoisMarie Williams on 10/21. Pt has New Ob appt @ [facility] on 11/14 @ 3pm.  I scheduled US for 11/6 @ 1100 and called pt.  Message left for her to call back and state whether a detailed message can be left on her voice mail. Pt needs to also be informed of the location of the office for her prenatal care and to call that office with future questions or needs. Their phone #[phone].   1020  Pt returned my call and I provided all of the previously stated information. She voiced understanding.

## 2015-12-27 ENCOUNTER — Encounter: Payer: Self-pay | Admitting: Family Medicine

## 2015-12-27 ENCOUNTER — Ambulatory Visit (HOSPITAL_COMMUNITY)
Admission: RE | Admit: 2015-12-27 | Discharge: 2015-12-27 | Disposition: A | Discharge status: Home / Self Care | Payer: Medicaid Other | Source: Ambulatory Visit | Attending: Advanced Practice Midwife | Admitting: Advanced Practice Midwife

## 2015-12-27 ENCOUNTER — Ambulatory Visit: Payer: Medicaid Other

## 2015-12-27 DIAGNOSIS — O283 Abnormal ultrasonic finding on antenatal screening of mother: Secondary | ICD-10-CM | POA: Insufficient documentation

## 2015-12-27 DIAGNOSIS — R109 Unspecified abdominal pain: Secondary | ICD-10-CM

## 2015-12-27 DIAGNOSIS — Z3A01 Less than 8 weeks gestation of pregnancy: Secondary | ICD-10-CM | POA: Diagnosis not present

## 2015-12-27 DIAGNOSIS — O26899 Other specified pregnancy related conditions, unspecified trimester: Secondary | ICD-10-CM

## 2015-12-27 DIAGNOSIS — Z3200 Encounter for pregnancy test, result unknown: Secondary | ICD-10-CM

## 2015-12-27 DIAGNOSIS — O209 Hemorrhage in early pregnancy, unspecified: Secondary | ICD-10-CM

## 2015-12-27 NOTE — Progress Notes (Signed)
Patient presented to office today for follow up on u/s results. Reviewed with provider Dr.Arnold who stated patient can start prenatal care at her ob of choice. Patient was advised to start prenatal vitamins which can be purchased OTC.

## 2016-01-04 ENCOUNTER — Other Ambulatory Visit (HOSPITAL_COMMUNITY)
Admission: RE | Admit: 2016-01-04 | Discharge: 2016-01-04 | Disposition: A | Discharge status: Home / Self Care | Payer: Medicaid Other | Source: Ambulatory Visit | Attending: Obstetrics & Gynecology | Admitting: Obstetrics & Gynecology

## 2016-01-04 ENCOUNTER — Encounter: Payer: Self-pay | Admitting: Obstetrics

## 2016-01-04 ENCOUNTER — Ambulatory Visit (INDEPENDENT_AMBULATORY_CARE_PROVIDER_SITE_OTHER): Payer: Medicaid Other | Admitting: Obstetrics & Gynecology

## 2016-01-04 ENCOUNTER — Encounter: Payer: Self-pay | Admitting: Obstetrics & Gynecology

## 2016-01-04 DIAGNOSIS — Z113 Encounter for screening for infections with a predominantly sexual mode of transmission: Secondary | ICD-10-CM | POA: Insufficient documentation

## 2016-01-04 DIAGNOSIS — Z3490 Encounter for supervision of normal pregnancy, unspecified, unspecified trimester: Secondary | ICD-10-CM | POA: Insufficient documentation

## 2016-01-04 DIAGNOSIS — Z3481 Encounter for supervision of other normal pregnancy, first trimester: Secondary | ICD-10-CM

## 2016-01-04 DIAGNOSIS — Z3480 Encounter for supervision of other normal pregnancy, unspecified trimester: Secondary | ICD-10-CM

## 2016-01-04 NOTE — Progress Notes (Signed)
Patient states that she feels good today. 

## 2016-01-05 LAB — GC/CHLAMYDIA PROBE AMP (~~LOC~~) NOT AT ARMC
Chlamydia: NEGATIVE
NEISSERIA GONORRHEA: NEGATIVE

## 2016-01-06 LAB — CULTURE, OB URINE

## 2016-01-06 LAB — URINE CULTURE, OB REFLEX

## 2016-01-11 LAB — PRENATAL PROFILE I(LABCORP)
Antibody Screen: NEGATIVE
BASOS ABS: 0 10*3/uL (ref 0.0–0.2)
Basos: 0 %
EOS (ABSOLUTE): 0 10*3/uL (ref 0.0–0.4)
Eos: 0 %
HEMOGLOBIN: 11.8 g/dL (ref 11.1–15.9)
HEP B S AG: NEGATIVE
Hematocrit: 36.1 % (ref 34.0–46.6)
IMMATURE GRANS (ABS): 0 10*3/uL (ref 0.0–0.1)
IMMATURE GRANULOCYTES: 0 %
LYMPHS: 18 %
Lymphocytes Absolute: 2 10*3/uL (ref 0.7–3.1)
MCH: 27.3 pg (ref 26.6–33.0)
MCHC: 32.7 g/dL (ref 31.5–35.7)
MCV: 83 fL (ref 79–97)
MONOS ABS: 0.7 10*3/uL (ref 0.1–0.9)
Monocytes: 6 %
NEUTROS PCT: 76 %
Neutrophils Absolute: 8.5 10*3/uL — ABNORMAL HIGH (ref 1.4–7.0)
PLATELETS: 273 10*3/uL (ref 150–379)
RBC: 4.33 x10E6/uL (ref 3.77–5.28)
RDW: 14.6 % (ref 12.3–15.4)
RPR Ser Ql: NONREACTIVE
Rh Factor: POSITIVE
Rubella Antibodies, IGG: 3.84 index (ref >0.99)
WBC: 11.3 10*3/uL — ABNORMAL HIGH (ref 3.4–10.8)

## 2016-01-11 LAB — TOXASSURE SELECT 13 (MW), URINE

## 2016-01-11 LAB — HIV ANTIBODY (ROUTINE TESTING W REFLEX): HIV SCREEN 4TH GENERATION: NONREACTIVE

## 2016-01-26 ENCOUNTER — Inpatient Hospital Stay (HOSPITAL_COMMUNITY)
Admission: AD | Admit: 2016-01-26 | Discharge: 2016-01-26 | Discharge status: Against Medical Advice | Payer: Medicaid Other | Source: Ambulatory Visit | Attending: Obstetrics and Gynecology | Admitting: Obstetrics and Gynecology

## 2016-01-26 DIAGNOSIS — R109 Unspecified abdominal pain: Secondary | ICD-10-CM | POA: Diagnosis present

## 2016-01-26 DIAGNOSIS — Z5321 Procedure and treatment not carried out due to patient leaving prior to being seen by health care provider: Secondary | ICD-10-CM | POA: Diagnosis not present

## 2016-01-26 LAB — URINALYSIS, ROUTINE W REFLEX MICROSCOPIC
BILIRUBIN URINE: NEGATIVE
Glucose, UA: NEGATIVE mg/dL
HGB URINE DIPSTICK: NEGATIVE
KETONES UR: NEGATIVE mg/dL
NITRITE: NEGATIVE
PROTEIN: NEGATIVE mg/dL
SPECIFIC GRAVITY, URINE: 1.019 (ref 1.005–1.030)
pH: 7 (ref 5.0–8.0)

## 2016-01-26 NOTE — MAU Note (Signed)
1847 not in lobby

## 2016-01-26 NOTE — MAU Note (Signed)
Not in lobby
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
## 2016-02-01 ENCOUNTER — Encounter: Payer: Medicaid Other | Admitting: Obstetrics and Gynecology

## 2016-02-16 ENCOUNTER — Other Ambulatory Visit (HOSPITAL_COMMUNITY)
Admission: RE | Admit: 2016-02-16 | Discharge: 2016-02-16 | Disposition: A | Discharge status: Home / Self Care | Payer: Medicaid Other | Source: Ambulatory Visit | Attending: Obstetrics and Gynecology | Admitting: Obstetrics and Gynecology

## 2016-02-16 ENCOUNTER — Ambulatory Visit (INDEPENDENT_AMBULATORY_CARE_PROVIDER_SITE_OTHER): Payer: Medicaid Other | Admitting: Obstetrics and Gynecology

## 2016-02-16 VITALS — BP 109/70 | HR 79 | Temp 97.7°F | Wt 165.4 lb

## 2016-02-16 DIAGNOSIS — Z1151 Encounter for screening for human papillomavirus (HPV): Secondary | ICD-10-CM | POA: Diagnosis present

## 2016-02-16 DIAGNOSIS — Z01419 Encounter for gynecological examination (general) (routine) without abnormal findings: Secondary | ICD-10-CM | POA: Diagnosis present

## 2016-02-16 DIAGNOSIS — Z3480 Encounter for supervision of other normal pregnancy, unspecified trimester: Secondary | ICD-10-CM

## 2016-02-16 DIAGNOSIS — Z3482 Encounter for supervision of other normal pregnancy, second trimester: Secondary | ICD-10-CM

## 2016-02-16 MED ORDER — CITRANATAL HARMONY 27-1-260 MG PO CAPS: 1.0000 | ORAL_CAPSULE | Freq: Every day | ORAL | 11 refills | Status: DC

## 2016-02-16 NOTE — Patient Instructions (Signed)
Second Trimester of Pregnancy The second trimester is from week 13 through week 28 (months 4 through 6). The second trimester is often a time when you feel your best. Your body has also adjusted to being pregnant, and you begin to feel better physically. Usually, morning sickness has lessened or quit completely, you may have more energy, and you may have an increase in appetite. The second trimester is also a time when the fetus is growing rapidly. At the end of the sixth month, the fetus is about 9 inches long and weighs about 1 pounds. You will likely begin to feel the baby move (quickening) between 18 and 20 weeks of the pregnancy. Body changes during your second trimester Your body continues to go through many changes during your second trimester. The changes vary from woman to woman.  Your weight will continue to increase. You will notice your lower abdomen bulging out.  You may begin to get stretch marks on your hips, abdomen, and breasts.  You may develop headaches that can be relieved by medicines. The medicines should be approved by your health care provider.  You may urinate more often because the fetus is pressing on your bladder.  You may develop or continue to have heartburn as a result of your pregnancy.  You may develop constipation because certain hormones are causing the muscles that push waste through your intestines to slow down.  You may develop hemorrhoids or swollen, bulging veins (varicose veins).  You may have back pain. This is caused by:  Weight gain.  Pregnancy hormones that are relaxing the joints in your pelvis.  A shift in weight and the muscles that support your balance.  Your breasts will continue to grow and they will continue to become tender.  Your gums may bleed and may be sensitive to brushing and flossing.  Dark spots or blotches (chloasma, mask of pregnancy) may develop on your face. This will likely fade after the baby is born.  A dark line  from your belly button to the pubic area (linea nigra) may appear. This will likely fade after the baby is born.  You may have changes in your hair. These can include thickening of your hair, rapid growth, and changes in texture. Some women also have hair loss during or after pregnancy, or hair that feels dry or thin. Your hair will most likely return to normal after your baby is born. What to expect at prenatal visits During a routine prenatal visit:  You will be weighed to make sure you and the fetus are growing normally.  Your blood pressure will be taken.  Your abdomen will be measured to track your baby's growth.  The fetal heartbeat will be listened to.  Any test results from the previous visit will be discussed. Your health care provider may ask you:  How you are feeling.  If you are feeling the baby move.  If you have had any abnormal symptoms, such as leaking fluid, bleeding, severe headaches, or abdominal cramping.  If you are using any tobacco products, including cigarettes, chewing tobacco, and electronic cigarettes.  If you have any questions. Other tests that may be performed during your second trimester include:  Blood tests that check for:  Low iron levels (anemia).  Gestational diabetes (between 24 and 28 weeks).  Rh antibodies. This is to check for a protein on red blood cells (Rh factor).  Urine tests to check for infections, diabetes, or protein in the urine.  An ultrasound to   confirm the proper growth and development of the baby.  An amniocentesis to check for possible genetic problems.  Fetal screens for spina bifida and Down syndrome.  HIV (human immunodeficiency virus) testing. Routine prenatal testing includes screening for HIV, unless you choose not to have this test. Follow these instructions at home: Eating and drinking  Continue to eat regular, healthy meals.  Avoid raw meat, uncooked cheese, cat litter boxes, and soil used by cats. These  carry germs that can cause birth defects in the baby.  Take your prenatal vitamins.  Take 1500-2000 mg of calcium daily starting at the 20th week of pregnancy until you deliver your baby.  If you develop constipation:  Take over-the-counter or prescription medicines.  Drink enough fluid to keep your urine clear or pale yellow.  Eat foods that are high in fiber, such as fresh fruits and vegetables, whole grains, and beans.  Limit foods that are high in fat and processed sugars, such as fried and sweet foods. Activity  Exercise only as directed by your health care provider. Experiencing uterine cramps is a good sign to stop exercising.  Avoid heavy lifting, wear low heel shoes, and practice good posture.  Wear your seat belt at all times when driving.  Rest with your legs elevated if you have leg cramps or low back pain.  Wear a good support bra for breast tenderness.  Do not use hot tubs, steam rooms, or saunas. Lifestyle  Avoid all smoking, herbs, alcohol, and unprescribed drugs. These chemicals affect the formation and growth of the baby.  Do not use any products that contain nicotine or tobacco, such as cigarettes and e-cigarettes. If you need help quitting, ask your health care provider.  A sexual relationship may be continued unless your health care provider directs you otherwise. General instructions  Follow your health care provider's instructions regarding medicine use. There are medicines that are either safe or unsafe to take during pregnancy.  Take warm sitz baths to soothe any pain or discomfort caused by hemorrhoids. Use hemorrhoid cream if your health care provider approves.  If you develop varicose veins, wear support hose. Elevate your feet for 15 minutes, 3-4 times a day. Limit salt in your diet.  Visit your dentist if you have not gone yet during your pregnancy. Use a soft toothbrush to brush your teeth and be gentle when you floss.  Keep all follow-up  prenatal visits as told by your health care provider. This is important. Contact a health care provider if:  You have dizziness.  You have mild pelvic cramps, pelvic pressure, or nagging pain in the abdominal area.  You have persistent nausea, vomiting, or diarrhea.  You have a bad smelling vaginal discharge.  You have pain with urination. Get help right away if:  You have a fever.  You are leaking fluid from your vagina.  You have spotting or bleeding from your vagina.  You have severe abdominal cramping or pain.  You have rapid weight gain or weight loss.  You have shortness of breath with chest pain.  You notice sudden or extreme swelling of your face, hands, ankles, feet, or legs.  You have not felt your baby move in over an hour.  You have severe headaches that do not go away with medicine.  You have vision changes. Summary  The second trimester is from week 13 through week 28 (months 4 through 6). It is also a time when the fetus is growing rapidly.  Your body goes   through many changes during pregnancy. The changes vary from woman to woman.  Avoid all smoking, herbs, alcohol, and unprescribed drugs. These chemicals affect the formation and growth your baby.  Do not use any tobacco products, such as cigarettes, chewing tobacco, and e-cigarettes. If you need help quitting, ask your health care provider.  Contact your health care provider if you have any questions. Keep all prenatal visits as told by your health care provider. This is important. This information is not intended to replace advice given to you by your health care provider. Make sure you discuss any questions you have with your health care provider. Document Released: 01/31/2001 Document Revised: 07/15/2015 Document Reviewed: 04/09/2012 Elsevier Interactive Patient Education  2017 [employer]  

## 2016-02-16 NOTE — Progress Notes (Signed)
Subjective:  Ann Dunn is a 25 y.o. [phone] at 8146w3d being seen today for ongoing prenatal care.  She is currently monitored for the following issues for this low-risk pregnancy and has Asthma and Supervision of normal pregnancy, antepartum on her problem list.  Patient reports no complaints.  Contractions: Not present. Vag. Bleeding: None.   . Denies leaking of fluid.   The following portions of the patient's history were reviewed and updated as appropriate: allergies, current medications, past family history, past medical history, past social history, past surgical history and problem list. Problem list updated.  Objective:   Vitals:   02/16/16 1513  BP: 109/70  Pulse: 79  Temp: 97.7 F (36.5 C)  Weight: 165 lb 6.4 oz (75 kg)    Fetal Status: Fetal Heart Rate (bpm): 154         General:  Alert, oriented and cooperative. Patient is in no acute distress.  Skin: Skin is warm and dry. No rash noted.   Cardiovascular: Normal heart rate noted  Respiratory: Normal respiratory effort, no problems with respiration noted  Abdomen: Soft, gravid, appropriate for gestational age. Pain/Pressure: Absent     Pelvic:  Cervical exam performed        Extremities: Normal range of motion.     Mental Status: Normal mood and affect. Normal behavior. Normal judgment and thought content.  Breast exam, sym inverted nipples bilaterally, no nipple d/c, masses or adenopathy Urinalysis: Urine Protein: Trace Urine Glucose: Negative  Assessment and Plan:  Pregnancy: [id number] at 6646w3d  1. Supervision of other normal pregnancy, antepartum Declines flu vaccine - Cytology - PAP - NuSwab Vaginitis Plus (VG+) - Prenat-FeFmCb-DSS-FA-DHA w/o A (CITRANATAL HARMONY) 27-1-260 MG CAPS; Take 1 tablet by mouth daily.  Dispense: 30 capsule; Refill: 11 Quad screen at next visit Anatomy scan schedule at next OB visit  Preterm labor symptoms and general obstetric precautions including but not limited to vaginal  bleeding, contractions, leaking of fluid and fetal movement were reviewed in detail with the patient. Please refer to After Visit Summary for other counseling recommendations.  No Follow-up on file.   Hermina StaggersMichael L Phelicia Dantes, MD

## 2016-02-17 LAB — CYTOLOGY - PAP
Diagnosis: NEGATIVE
HPV: NOT DETECTED

## 2016-02-19 LAB — NUSWAB VAGINITIS PLUS (VG+)
CANDIDA ALBICANS, NAA: NEGATIVE
CANDIDA GLABRATA, NAA: NEGATIVE
Chlamydia trachomatis, NAA: NEGATIVE
Neisseria gonorrhoeae, NAA: NEGATIVE
Trich vag by NAA: NEGATIVE

## 2016-03-15 ENCOUNTER — Ambulatory Visit (INDEPENDENT_AMBULATORY_CARE_PROVIDER_SITE_OTHER): Payer: Medicaid Other | Admitting: Obstetrics & Gynecology

## 2016-03-15 ENCOUNTER — Encounter: Payer: Self-pay | Admitting: Obstetrics

## 2016-03-15 DIAGNOSIS — Z3482 Encounter for supervision of other normal pregnancy, second trimester: Secondary | ICD-10-CM

## 2016-03-15 DIAGNOSIS — Z3480 Encounter for supervision of other normal pregnancy, unspecified trimester: Secondary | ICD-10-CM

## 2016-03-20 ENCOUNTER — Ambulatory Visit (HOSPITAL_COMMUNITY)
Admission: RE | Admit: 2016-03-20 | Discharge: 2016-03-20 | Disposition: A | Discharge status: Home / Self Care | Payer: Medicaid Other | Source: Ambulatory Visit | Attending: Obstetrics & Gynecology | Admitting: Obstetrics & Gynecology

## 2016-03-20 ENCOUNTER — Other Ambulatory Visit: Payer: Self-pay | Admitting: Obstetrics & Gynecology

## 2016-03-20 DIAGNOSIS — Z3A19 19 weeks gestation of pregnancy: Secondary | ICD-10-CM

## 2016-03-20 DIAGNOSIS — O99320 Drug use complicating pregnancy, unspecified trimester: Secondary | ICD-10-CM

## 2016-03-20 DIAGNOSIS — O99212 Obesity complicating pregnancy, second trimester: Secondary | ICD-10-CM | POA: Diagnosis not present

## 2016-03-20 DIAGNOSIS — Z3480 Encounter for supervision of other normal pregnancy, unspecified trimester: Secondary | ICD-10-CM

## 2016-03-20 DIAGNOSIS — O99322 Drug use complicating pregnancy, second trimester: Secondary | ICD-10-CM | POA: Diagnosis not present

## 2016-03-20 DIAGNOSIS — Z363 Encounter for antenatal screening for malformations: Secondary | ICD-10-CM | POA: Insufficient documentation

## 2016-04-11 ENCOUNTER — Encounter (HOSPITAL_COMMUNITY): Payer: Self-pay | Admitting: *Deleted

## 2016-04-11 ENCOUNTER — Emergency Department (HOSPITAL_COMMUNITY)
Admission: EM | Admit: 2016-04-11 | Discharge: 2016-04-11 | Disposition: A | Discharge status: Home / Self Care | Payer: Medicaid Other | Attending: Emergency Medicine | Admitting: Emergency Medicine

## 2016-04-11 DIAGNOSIS — Z91040 Latex allergy status: Secondary | ICD-10-CM | POA: Diagnosis not present

## 2016-04-11 DIAGNOSIS — J45909 Unspecified asthma, uncomplicated: Secondary | ICD-10-CM | POA: Insufficient documentation

## 2016-04-11 DIAGNOSIS — J069 Acute upper respiratory infection, unspecified: Secondary | ICD-10-CM | POA: Diagnosis not present

## 2016-04-11 DIAGNOSIS — Z3A22 22 weeks gestation of pregnancy: Secondary | ICD-10-CM | POA: Diagnosis not present

## 2016-04-11 DIAGNOSIS — O99512 Diseases of the respiratory system complicating pregnancy, second trimester: Secondary | ICD-10-CM | POA: Diagnosis not present

## 2016-04-11 DIAGNOSIS — Z87891 Personal history of nicotine dependence: Secondary | ICD-10-CM | POA: Diagnosis not present

## 2016-04-11 DIAGNOSIS — B9789 Other viral agents as the cause of diseases classified elsewhere: Secondary | ICD-10-CM

## 2016-04-11 NOTE — ED Provider Notes (Signed)
[facility]-EMERGENCY DEPT Provider Note   CSN: [id number] Arrival date & time: 04/11/16  1213  By signing my name below, I, Marnette Burgess Long, attest that this documentation has been prepared under the direction and in the presence of Ok Edwards, [place]. Electronically Signed: Marnette Burgess Long, Scribe. 04/11/2016. 1:22 PM.   History   Chief Complaint Chief Complaint  Patient presents with  . Headache  . Cough   The history is provided by the patient. No language interpreter was used.   HPI Comments:  CHABELY NORBY is a pregnant 26 y.o. female [redacted]w[redacted]d G57P0A2 with a PMHx of Asthma, Bacterial vaginosis, yeast infection, polycystic ovarian syndrome, and h/o induced and spontaneous abortions, who presents to the Emergency Department complaining of persistent, non-productive, dry cough onset two days. She reports this cough has persisted since the night of 04/09/16. She has associated symptoms of HA, sore throat d/t cough, and neck lymph node discomfort. She reports she really just wants to know if she has the flu or not. She is currently on prenatal vitamins. Pt denies abdominal pain, nausea, vomiting, fever, and any other associated symptoms at this time.  Past Medical History:  Diagnosis Date  . Active labor at term 07/03/2012  . Asthma    Triggered by grass and pollen;inhaler prn  . Bacterial vaginosis   . H/O induced abortion 11/27/2011  . H/O spontaneous abortion, currently pregnant 11/27/2011   SAB in Feb 2013  . Headache(784.0)   . Infection 2010;2011   Yeast x 2  . Infection    BV x 1  . PCOS (polycystic ovarian syndrome)   . Vertigo    Not officially dx'd    Patient Active Problem List   Diagnosis Date Noted  . Supervision of normal pregnancy, antepartum 01/04/2016  . Asthma 11/15/2011    Past Surgical History:  Procedure Laterality Date  . ARM WOUND REPAIR / CLOSURE    . INDUCED ABORTION      OB History    Gravida Para Term Preterm AB Living   5 2 2   2 2    SAB  TAB Ectopic Multiple Live Births   1 1   0 2       Home Medications    Prior to Admission medications   Medication Sig Start Date End Date Taking? Authorizing Provider  Prenat-FeFmCb-DSS-FA-DHA w/o A (CITRANATAL HARMONY) 27-1-260 MG CAPS Take 1 tablet by mouth daily. 02/16/16   Hermina Staggers, MD    Family History Family History  Problem Relation Age of Onset  . Stroke Maternal Grandmother   . Cancer Maternal Grandmother     Social History Social History  Substance Use Topics  . Smoking status: Former Smoker    Packs/day: 0.00    Types: Cigarettes    Quit date: 08/13/2011  . Smokeless tobacco: Never Used     Comment: marijuana in the past  . Alcohol use No     Comment: 5 years ago- socially     Allergies   Latex   Review of Systems Review of Systems  Constitutional: Negative for fever.  HENT: Positive for sore throat (d/t cough).   Respiratory: Positive for cough.   Gastrointestinal: Negative for abdominal pain, nausea and vomiting.  Neurological: Positive for headaches.  All other systems reviewed and are negative.    Physical Exam Updated Vital Signs BP 109/66   Pulse 84   Temp 99.8 F (37.7 C) (Oral)   Resp 18   LMP 10/26/2015  SpO2 99%   Physical Exam  Constitutional: She is oriented to person, place, and time. She appears well-developed and well-nourished.  HENT:  Head: Normocephalic.  Slight erythematous throat.   Eyes: Conjunctivae are normal.  Cardiovascular: Normal rate.   Pulmonary/Chest: Effort normal.  Abdominal: She exhibits no distension.  Musculoskeletal: Normal range of motion.  Neurological: She is alert and oriented to person, place, and time.  Skin: Skin is warm and dry.  Psychiatric: She has a normal mood and affect.  Nursing note and vitals reviewed.    ED Treatments / Results  DIAGNOSTIC STUDIES:  Oxygen Saturation is 99% on RA, normal by my interpretation.    COORDINATION OF CARE:  1:20 PM Discussed treatment  plan with pt at bedside including rest, increased fluid intake, Tylenol and pt agreed to plan.  Labs (all labs ordered are listed, but only abnormal results are displayed) Labs Reviewed - No data to display  EKG  EKG Interpretation None       Radiology No results found.  Procedures Procedures (including critical care time)  Medications Ordered in ED Medications - No data to display   Initial Impression / Assessment and Plan / ED Course  I have reviewed the triage vital signs and the nursing notes.  Pertinent labs & imaging results that were available during my care of the patient were reviewed by me and considered in my medical decision making (see chart for details).       Final Clinical Impressions(s) / ED Diagnoses   Final diagnoses:  Viral URI with cough    New Prescriptions New Prescriptions   No medications on file   An After Visit Summary was printed and given to the patient.  I personally performed the services in this documentation, which was scribed in my presence.  The recorded information has been reviewed and considered.   Barnet PallKaren SofiaPAC.     Lonia SkinnerLeslie K Flint HillSofia, PA-C 04/11/16 1333    Alvira MondayErin Schlossman, MD 04/13/16 2300

## 2016-04-11 NOTE — Discharge Instructions (Signed)
Return if any problems.

## 2016-04-11 NOTE — ED Triage Notes (Addendum)
Pt reports having non productive cough, headache, sore throat x 2 days. Denies n/v, denies fever. Pt is [redacted] weeks pregnant, denies any ob/gyn complaints, just requesting eval for possible flu. Mask on pt at triage.

## 2016-04-12 ENCOUNTER — Ambulatory Visit (INDEPENDENT_AMBULATORY_CARE_PROVIDER_SITE_OTHER): Payer: Medicaid Other | Admitting: Certified Nurse Midwife

## 2016-04-12 VITALS — BP 110/71 | HR 94 | Wt 175.0 lb

## 2016-04-12 DIAGNOSIS — Z3480 Encounter for supervision of other normal pregnancy, unspecified trimester: Secondary | ICD-10-CM

## 2016-04-12 DIAGNOSIS — Z3482 Encounter for supervision of other normal pregnancy, second trimester: Secondary | ICD-10-CM

## 2016-04-12 NOTE — Progress Notes (Signed)
   PRENATAL VISIT NOTE  Subjective:  Ann Dunn is a 26 y.o. [phone] at 6636w3d being seen today for ongoing prenatal care.  She is currently monitored for the following issues for this low-risk pregnancy and has Asthma and Supervision of normal pregnancy, antepartum on her problem list.  Patient reports no bleeding, no contractions, no cramping and no leaking.  Contractions: Not present. Vag. Bleeding: None.  Movement: Present. Denies leaking of fluid.   The following portions of the patient's history were reviewed and updated as appropriate: allergies, current medications, past family history, past medical history, past social history, past surgical history and problem list. Problem list updated.  Objective:  There were no vitals filed for this visit.  Fetal Status:     Movement: Present     General:  Alert, oriented and cooperative. Patient is in no acute distress.  Skin: Skin is warm and dry. No rash noted.   Cardiovascular: Normal heart rate noted  Respiratory: Normal respiratory effort, no problems with respiration noted  Abdomen: Soft, gravid, appropriate for gestational age. Pain/Pressure: Absent     Pelvic:  Cervical exam deferred        Extremities: Normal range of motion.     Mental Status: Normal mood and affect. Normal behavior. Normal judgment and thought content.   Assessment and Plan:  Pregnancy: [id number] at 736w3d  1. Supervision of other normal pregnancy, antepartum F/u in 4 weeks for ROB, continue to drink water, will sign BTL papers and consent next visit, in 2 visit will do labs and GTT.   Preterm labor symptoms and general obstetric precautions including but not limited to vaginal bleeding, contractions, leaking of fluid and fetal movement were reviewed in detail with the patient. Please refer to After Visit Summary for other counseling recommendations.  Return in about 4 weeks (around 05/10/2016) for ROB.   Lynnell JudeJade C Garcia Dalzell, FNP

## 2016-04-12 NOTE — Patient Instructions (Signed)
Pregnancy  First Trimester of Pregnancy The first trimester of pregnancy is from week 1 until the end of week 12 (months 1 through 3). During this time, your baby will begin to develop inside you. At 6-8 weeks, the eyes and face are formed, and the heartbeat can be seen on ultrasound. At the end of 12 weeks, all the baby's organs are formed. Prenatal care is all the medical care you receive before the birth of your baby. Make sure you get good prenatal care and follow all of your doctor's instructions. Follow these instructions at home: Medicines  Take medicine only as told by your doctor. Some medicines are safe and some are not during pregnancy.  Take your prenatal vitamins as told by your doctor.  Take medicine that helps you poop (stool softener) as needed if your doctor says it is okay. Diet  Eat regular, healthy meals.  Your doctor will tell you the amount of weight gain that is right for you.  Avoid raw meat and uncooked cheese.  If you feel sick to your stomach (nauseous) or throw up (vomit):  Eat 4 or 5 small meals a day instead of 3 large meals.  Try eating a few soda crackers.  Drink liquids between meals instead of during meals.  If you have a hard time pooping (constipation):  Eat high-fiber foods like fresh vegetables, fruit, and whole grains.  Drink enough fluids to keep your pee (urine) clear or pale yellow. Activity and Exercise  Exercise only as told by your doctor. Stop exercising if you have cramps or pain in your lower belly (abdomen) or low back.  Try to avoid standing for long periods of time. Move your legs often if you must stand in one place for a long time.  Avoid heavy lifting.  Wear low-heeled shoes. Sit and stand up straight.  You can have sex unless your doctor tells you not to. Relief of Pain or Discomfort  Wear a good support bra if your breasts are sore.  Take warm water baths (sitz baths) to soothe pain or discomfort caused by  hemorrhoids. Use hemorrhoid cream if your doctor says it is okay.  Rest with your legs raised if you have leg cramps or low back pain.  Wear support hose if you have puffy, bulging veins (varicose veins) in your legs. Raise (elevate) your feet for 15 minutes, 3-4 times a day. Limit salt in your diet. Prenatal Care  Schedule your prenatal visits by the twelfth week of pregnancy.  Write down your questions. Take them to your prenatal visits.  Keep all your prenatal visits as told by your doctor. Safety  Wear your seat belt at all times when driving.  Make a list of emergency phone numbers. The list should include numbers for family, friends, the hospital, and police and fire departments. General Tips  Ask your doctor for a referral to a local prenatal class. Begin classes no later than at the start of month 6 of your pregnancy.  Ask for help if you need counseling or help with nutrition. Your doctor can give you advice or tell you where to go for help.  Do not use hot tubs, steam rooms, or saunas.  Do not douche or use tampons or scented sanitary pads.  Do not cross your legs for long periods of time.  Avoid litter boxes and soil used by cats.  Avoid all smoking, herbs, and alcohol. Avoid drugs not approved by your doctor.  Do not use any  tobacco products, including cigarettes, chewing tobacco, and electronic cigarettes. If you need help quitting, ask your doctor. You may get counseling or other support to help you quit.  Visit your dentist. At home, brush your teeth with a soft toothbrush. Be gentle when you floss. Get help if:  You are dizzy.  You have mild cramps or pressure in your lower belly.  You have a nagging pain in your belly area.  You continue to feel sick to your stomach, throw up, or have watery poop (diarrhea).  You have a bad smelling fluid coming from your vagina.  You have pain with peeing (urination).  You have increased puffiness (swelling) in your  face, hands, legs, or ankles. Get help right away if:  You have a fever.  You are leaking fluid from your vagina.  You have spotting or bleeding from your vagina.  You have very bad belly cramping or pain.  You gain or lose weight rapidly.  You throw up blood. It may look like coffee grounds.  You are around people who have [place] measles, fifth disease, or chickenpox.  You have a very bad headache.  You have shortness of breath.  You have any kind of trauma, such as from a fall or a car accident. This information is not intended to replace advice given to you by your health care provider. Make sure you discuss any questions you have with your health care provider. Document Released: 07/26/2007 Document Revised: 07/15/2015 Document Reviewed: 12/17/2012 Elsevier Interactive Patient Education  2017 [employer].

## 2016-05-10 ENCOUNTER — Ambulatory Visit (INDEPENDENT_AMBULATORY_CARE_PROVIDER_SITE_OTHER): Payer: Medicaid Other | Admitting: Certified Nurse Midwife

## 2016-05-10 ENCOUNTER — Encounter: Payer: Self-pay | Admitting: Certified Nurse Midwife

## 2016-05-10 DIAGNOSIS — Z3480 Encounter for supervision of other normal pregnancy, unspecified trimester: Secondary | ICD-10-CM

## 2016-05-10 DIAGNOSIS — Z3482 Encounter for supervision of other normal pregnancy, second trimester: Secondary | ICD-10-CM

## 2016-05-10 NOTE — Progress Notes (Signed)
Patient reports good fetal movement. 

## 2016-05-10 NOTE — Progress Notes (Signed)
   PRENATAL VISIT NOTE  Subjective:  Ann Dunn is a 26 y.o. [phone] at 3379w3d being seen today for ongoing prenatal care.  She is currently monitored for the following issues for this low-risk pregnancy and has Asthma and Supervision of normal pregnancy, antepartum on her problem list.  Patient reports no complaints.  Contractions: Not present. Vag. Bleeding: None.  Movement: Present. Denies leaking of fluid.   The following portions of the patient's history were reviewed and updated as appropriate: allergies, current medications, past family history, past medical history, past social history, past surgical history and problem list. Problem list updated.  Objective:   Vitals:   05/10/16 1618  BP: 116/74  Pulse: 81  Weight: 178 lb 3.2 oz (80.8 kg)    Fetal Status: Fetal Heart Rate (bpm): 143 Fundal Height: 27 cm Movement: Present     General:  Alert, oriented and cooperative. Patient is in no acute distress.  Skin: Skin is warm and dry. No rash noted.   Cardiovascular: Normal heart rate noted  Respiratory: Normal respiratory effort, no problems with respiration noted  Abdomen: Soft, gravid, appropriate for gestational age. Pain/Pressure: Absent     Pelvic:  Cervical exam deferred        Extremities: Normal range of motion.  Edema: None  Mental Status: Normal mood and affect. Normal behavior. Normal judgment and thought content.   Assessment and Plan:  Pregnancy: [id number] at 2479w3d  1. Supervision of other normal pregnancy, antepartum     Doing well.   Preterm labor symptoms and general obstetric precautions including but not limited to vaginal bleeding, contractions, leaking of fluid and fetal movement were reviewed in detail with the patient. Please refer to After Visit Summary for other counseling recommendations.  Return in about 2 weeks (around 05/24/2016) for ROB, 2 hr OGTT.   Roe Coombsachelle A Alexine Pilant, [name]

## 2016-05-24 ENCOUNTER — Encounter: Payer: Self-pay | Admitting: Certified Nurse Midwife

## 2016-05-24 ENCOUNTER — Ambulatory Visit (INDEPENDENT_AMBULATORY_CARE_PROVIDER_SITE_OTHER): Payer: Medicaid Other | Admitting: Certified Nurse Midwife

## 2016-05-24 ENCOUNTER — Other Ambulatory Visit: Payer: Medicaid Other

## 2016-05-24 ENCOUNTER — Other Ambulatory Visit (HOSPITAL_COMMUNITY)
Admission: RE | Admit: 2016-05-24 | Discharge: 2016-05-24 | Disposition: A | Discharge status: Home / Self Care | Payer: Medicaid Other | Source: Ambulatory Visit | Attending: Certified Nurse Midwife | Admitting: Certified Nurse Midwife

## 2016-05-24 VITALS — BP 115/73 | HR 92 | Wt 175.4 lb

## 2016-05-24 DIAGNOSIS — Z3483 Encounter for supervision of other normal pregnancy, third trimester: Secondary | ICD-10-CM

## 2016-05-24 DIAGNOSIS — N898 Other specified noninflammatory disorders of vagina: Secondary | ICD-10-CM | POA: Diagnosis present

## 2016-05-24 DIAGNOSIS — B373 Candidiasis of vulva and vagina: Secondary | ICD-10-CM

## 2016-05-24 DIAGNOSIS — B3731 Acute candidiasis of vulva and vagina: Secondary | ICD-10-CM

## 2016-05-24 DIAGNOSIS — Z3480 Encounter for supervision of other normal pregnancy, unspecified trimester: Secondary | ICD-10-CM

## 2016-05-24 MED ORDER — TERCONAZOLE 0.8 % VA CREA: 1.0000 | TOPICAL_CREAM | Freq: Every day | VAGINAL | 0 refills | Status: DC

## 2016-05-24 MED ORDER — FLUCONAZOLE 150 MG PO TABS: 150.0000 mg | ORAL_TABLET | Freq: Once | ORAL | 0 refills | Status: AC

## 2016-05-24 NOTE — Progress Notes (Signed)
   PRENATAL VISIT NOTE  Subjective:  Ann Dunn is a 26 y.o. [phone] at [redacted]w[redacted]d being seen today for ongoing prenatal care.  She is currently monitored for the following issues for this low-risk pregnancy and has Asthma and Supervision of normal pregnancy, antepartum on her problem list.  Patient reports vaginal irritation with some discharge.  Contractions: Not present. Vag. Bleeding: None.  Movement: Present. Denies leaking of fluid.   The following portions of the patient's history were reviewed and updated as appropriate: allergies, current medications, past family history, past medical history, past social history, past surgical history and problem list. Problem list updated.  Objective:   Vitals:   05/24/16 0859  BP: 115/73  Pulse: 92  Weight: 175 lb 6.4 oz (79.6 kg)    Fetal Status:     Movement: Present     General:  Alert, oriented and cooperative. Patient is in no acute distress.  Skin: Skin is warm and dry. No rash noted.   Cardiovascular: Normal heart rate noted  Respiratory: Normal respiratory effort, no problems with respiration noted  Abdomen: Soft, gravid, appropriate for gestational age. Pain/Pressure: Absent     Pelvic:  Cervical exam deferred        Extremities: Normal range of motion.  Edema: None  Mental Status: Normal mood and affect. Normal behavior. Normal judgment and thought content.   Assessment and Plan:  Pregnancy: [id number] at [redacted]w[redacted]d  1. Vaginal irritation Supervision of other normal pregnancy, antepartum - Plan: Glucose Tolerance, 2 Hours w/1 Hour, CBC, HIV antibody, RPR, [place] MFM OB FOLLOW UP  Vaginal irritation - Plan: Cervicovaginal ancillary only  Yeast infection of the vagina - Plan: fluconazole (DIFLUCAN) 150 MG tablet, terconazole (TERAZOL 3) 0.8 % vaginal cream  f/u in 2 weeks for Rob,counseled on birth control options,patient undecided. - Glucose Tolerance, 2 Hours w/1 Hour - CBC - HIV antibody - RPR  Preterm labor symptoms and  general obstetric precautions including but not limited to vaginal bleeding, contractions, leaking of fluid and fetal movement were reviewed in detail with the patient. Please refer to After Visit Summary for other counseling recommendations.    Elinor Parkinson, Student-MidWife

## 2016-05-24 NOTE — Progress Notes (Signed)
Patient reports some irritation.

## 2016-05-25 ENCOUNTER — Other Ambulatory Visit: Payer: Self-pay | Admitting: Certified Nurse Midwife

## 2016-05-25 DIAGNOSIS — Z3480 Encounter for supervision of other normal pregnancy, unspecified trimester: Secondary | ICD-10-CM

## 2016-05-25 LAB — CERVICOVAGINAL ANCILLARY ONLY
BACTERIAL VAGINITIS: NEGATIVE
CANDIDA VAGINITIS: POSITIVE — AB
Chlamydia: NEGATIVE
NEISSERIA GONORRHEA: NEGATIVE
Trichomonas: NEGATIVE

## 2016-05-25 LAB — CBC
HEMATOCRIT: 34.4 % (ref 34.0–46.6)
HEMOGLOBIN: 10.9 g/dL — AB (ref 11.1–15.9)
MCH: 25.4 pg — ABNORMAL LOW (ref 26.6–33.0)
MCHC: 31.7 g/dL (ref 31.5–35.7)
MCV: 80 fL (ref 79–97)
Platelets: 232 10*3/uL (ref 150–379)
RBC: 4.29 x10E6/uL (ref 3.77–5.28)
RDW: 15.4 % (ref 12.3–15.4)
WBC: 11.2 10*3/uL — ABNORMAL HIGH (ref 3.4–10.8)

## 2016-05-25 LAB — HIV ANTIBODY (ROUTINE TESTING W REFLEX): HIV Screen 4th Generation wRfx: NONREACTIVE

## 2016-05-25 LAB — GLUCOSE TOLERANCE, 2 HOURS W/ 1HR
GLUCOSE, 2 HOUR: 128 mg/dL (ref 65–152)
Glucose, 1 hour: 156 mg/dL (ref 65–179)
Glucose, Fasting: 89 mg/dL (ref 65–91)

## 2016-05-25 LAB — RPR: RPR: NONREACTIVE

## 2016-05-26 ENCOUNTER — Encounter (HOSPITAL_COMMUNITY): Payer: Self-pay | Admitting: *Deleted

## 2016-05-26 ENCOUNTER — Inpatient Hospital Stay (HOSPITAL_COMMUNITY)
Admission: AD | Admit: 2016-05-26 | Discharge: 2016-05-26 | Disposition: A | Discharge status: Home / Self Care | Payer: Medicaid Other | Source: Ambulatory Visit | Attending: Obstetrics & Gynecology | Admitting: Obstetrics & Gynecology

## 2016-05-26 DIAGNOSIS — Z87891 Personal history of nicotine dependence: Secondary | ICD-10-CM | POA: Insufficient documentation

## 2016-05-26 DIAGNOSIS — E282 Polycystic ovarian syndrome: Secondary | ICD-10-CM | POA: Insufficient documentation

## 2016-05-26 DIAGNOSIS — O4693 Antepartum hemorrhage, unspecified, third trimester: Secondary | ICD-10-CM | POA: Insufficient documentation

## 2016-05-26 DIAGNOSIS — J45909 Unspecified asthma, uncomplicated: Secondary | ICD-10-CM | POA: Diagnosis not present

## 2016-05-26 DIAGNOSIS — O4690 Antepartum hemorrhage, unspecified, unspecified trimester: Secondary | ICD-10-CM

## 2016-05-26 DIAGNOSIS — O368130 Decreased fetal movements, third trimester, not applicable or unspecified: Secondary | ICD-10-CM | POA: Diagnosis not present

## 2016-05-26 DIAGNOSIS — O368131 Decreased fetal movements, third trimester, fetus 1: Secondary | ICD-10-CM

## 2016-05-26 DIAGNOSIS — Z3A28 28 weeks gestation of pregnancy: Secondary | ICD-10-CM | POA: Insufficient documentation

## 2016-05-26 LAB — URINALYSIS, ROUTINE W REFLEX MICROSCOPIC
Bilirubin Urine: NEGATIVE
Glucose, UA: NEGATIVE mg/dL
Hgb urine dipstick: NEGATIVE
Ketones, ur: NEGATIVE mg/dL
NITRITE: NEGATIVE
PROTEIN: NEGATIVE mg/dL
SPECIFIC GRAVITY, URINE: 1.011 (ref 1.005–1.030)
pH: 6 (ref 5.0–8.0)

## 2016-05-26 NOTE — Discharge Instructions (Signed)

## 2016-05-26 NOTE — MAU Provider Note (Signed)
Patient Ann Dunn is a 26 year old G5P2022 at 28 weeks and 5 days with complaints of decreased fetal movements, intermittent abdominal cramping this morning and afternoon  and 2-3 dark red spots on her underwear this afternoon. SHe has had intermittent abdominal pain throughout the past few weeks but when she saw some spotting she decided to come in and get checked out.    Patient had a prenatal appointment at [facility] on 05-24-2016 and was diagnosed with a yeast infection. She is on day 3 of her treatment. Her  gonorrhea and chlamydia cultures from that visit were negative; her wet prep was positive for yeast but not BV or trich.      CSN: [id number]  Arrival date and time: 05/26/16 1519   First Provider Initiated Contact with Patient 05/26/16 1624      No chief complaint on file.  Abdominal Pain  This is a new problem. The current episode started today. The problem occurs 2 to 4 times per day. The problem has been unchanged. The pain is located in the suprapubic region. The pain is at a severity of 6/10. The quality of the pain is aching and cramping. The abdominal pain does not radiate. Pertinent negatives include no constipation, diarrhea, frequency, headaches, hematuria, nausea or vomiting. The pain is relieved by movement. She has tried nothing for the symptoms.  Vaginal Bleeding  The patient's primary symptoms include vaginal bleeding. The patient's pertinent negatives include no genital itching, genital lesions, genital odor, genital rash, missed menses or pelvic pain. This is a new problem. The current episode started today. The problem occurs 2 to 4 times per day. The problem has been unchanged. Associated symptoms include abdominal pain. Pertinent negatives include no constipation, diarrhea, frequency, headaches, hematuria, nausea or vomiting. The vaginal discharge was thick. The vaginal bleeding is spotting. She has not been passing clots. She has not been passing tissue. Nothing aggravates  the symptoms.    OB History    Gravida Para Term Preterm AB Living   SAB TAB Ectopic Multiple Live Births   1 1   0 2      Past Medical History:  Diagnosis Date  . Active labor at term 07/03/2012  . Asthma    Triggered by grass and pollen;inhaler prn  . Bacterial vaginosis   . H/O induced abortion 11/27/2011  . H/O spontaneous abortion, currently pregnant 11/27/2011   SAB in Feb 2013  . Headache(784.0)   . Infection 2010;2011   Yeast x 2  . Infection    BV x 1  . PCOS (polycystic ovarian syndrome)   . Vertigo    Not officially dx'd    Past Surgical History:  Procedure Laterality Date  . ARM WOUND REPAIR / CLOSURE    . INDUCED ABORTION      Family History  Problem Relation Age of Onset  . Stroke Maternal Grandmother   . Cancer Maternal Grandmother     Social History  Substance Use Topics  . Smoking status: Former Smoker    Packs/day: 0.00    Types: Cigarettes    Quit date: 08/13/2011  . Smokeless tobacco: Never Used     Comment: marijuana in the past  . Alcohol use No     Comment: 5 years ago- socially    Allergies:  Allergies  Allergen Reactions  . Latex Rash    Prescriptions Prior to Admission  Medication Sig Dispense Refill Last Dose  .  Prenat-FeFmCb-DSS-FA-DHA w/o A (CITRANATAL HARMONY) 27-1-260 MG CAPS Take 1 tablet by mouth daily. 30 capsule 11 05/25/2016 at Unknown time  . terconazole (TERAZOL 3) 0.8 % vaginal cream Place 1 applicator vaginally at bedtime. 20 g 0 05/25/2016 at Unknown time    Review of Systems  HENT: Negative.   Respiratory: Negative.   Cardiovascular: Negative.   Gastrointestinal: Positive for abdominal pain. Negative for constipation, diarrhea, nausea and vomiting.       She has had abdominal pain off and on for a few weeks but today it got worse. SHe feels like it gets better with movement and drinking water.   Genitourinary: Positive for vaginal bleeding. Negative for frequency, hematuria, missed menses and  pelvic pain.  Musculoskeletal: Negative.   Neurological: Negative for headaches.  Psychiatric/Behavioral: Negative.    Physical Exam   Blood pressure 116/61, pulse 79, temperature 98.2 F (36.8 C), temperature source Oral, resp. rate 16, last menstrual period 10/26/2015, unknown if currently breastfeeding.  Physical Exam  Constitutional: She appears well-developed.  HENT:  Head: Normocephalic.  Neck: Normal range of motion.  Cardiovascular: Normal rate.   Respiratory: Effort normal.  GI: Soft. She exhibits no distension and no mass. There is tenderness. There is no rebound and no guarding.  Mild tenderness on bimanual exam; no CVAT  Genitourinary:  Genitourinary Comments: NEFG; vaginal walls pink with nol lesions. Scant clumpy white discharge in the vagina, cervix is pink with no lesions. No blood in the vagina. No CMT, slight suprapubic tenderness on palpation, no adnexal tenderness.     MAU Course  Procedures  MDM -NST: 125. Present acels, negative decels, moderate variability and no contractions -wet prep and gc ct deferred as they were collected on 05-24-2016 -UA: negative PAtient reassured after reactive NST; says she now feels the baby moving quite a lot.  Assessment and Plan  1.  Vaginal bleeding during pregnancy, antepartum  Decreased fetal movements in third trimester, fetus 1 of multiple gestation  2. Patient stable for discharge with instructions to keep her prenatal appointment on 06-08-2016  3. Discussed when to return to MAU (increased bleeding, leaking of fluid, abdominal pain that worsens, or decreased fetal movements).    Charlesetta Garibaldi Hudsen Fei [name] 05/26/2016, 5:01 PM

## 2016-05-26 NOTE — MAU Note (Signed)
Pt reports she has been spotting every time she wipes today and having lower abd pain. Also reports no fetal movement since 8 am today.

## 2016-06-08 ENCOUNTER — Ambulatory Visit (HOSPITAL_COMMUNITY)
Admission: RE | Admit: 2016-06-08 | Discharge: 2016-06-08 | Disposition: A | Discharge status: Home / Self Care | Payer: Medicaid Other | Source: Ambulatory Visit | Attending: Certified Nurse Midwife | Admitting: Certified Nurse Midwife

## 2016-06-08 ENCOUNTER — Encounter: Payer: Self-pay | Admitting: Certified Nurse Midwife

## 2016-06-08 ENCOUNTER — Ambulatory Visit (INDEPENDENT_AMBULATORY_CARE_PROVIDER_SITE_OTHER): Payer: Medicaid Other | Admitting: Certified Nurse Midwife

## 2016-06-08 VITALS — BP 117/67 | HR 85 | Wt 185.0 lb

## 2016-06-08 DIAGNOSIS — Z3483 Encounter for supervision of other normal pregnancy, third trimester: Secondary | ICD-10-CM | POA: Diagnosis not present

## 2016-06-08 DIAGNOSIS — Z3A30 30 weeks gestation of pregnancy: Secondary | ICD-10-CM | POA: Insufficient documentation

## 2016-06-08 DIAGNOSIS — O99213 Obesity complicating pregnancy, third trimester: Secondary | ICD-10-CM | POA: Insufficient documentation

## 2016-06-08 DIAGNOSIS — O99320 Drug use complicating pregnancy, unspecified trimester: Secondary | ICD-10-CM | POA: Diagnosis not present

## 2016-06-08 DIAGNOSIS — Z3480 Encounter for supervision of other normal pregnancy, unspecified trimester: Secondary | ICD-10-CM

## 2016-06-08 NOTE — Progress Notes (Signed)
BTL signed 06/08/2016

## 2016-06-08 NOTE — Progress Notes (Signed)
   PRENATAL VISIT NOTE  Subjective:  Ann Dunn is a 26 y.o. [phone] at [redacted]w[redacted]d being seen today for ongoing prenatal care.  She is currently monitored for the following issues for this low-risk pregnancy and has Asthma and Supervision of normal pregnancy, antepartum on her problem list.  Patient reports backache and no other complaints. Reports feeling better and has no symptoms of yeast infectroin.  Contractions: Not present. Vag. Bleeding: None.  Movement: Present. Denies leaking of fluid.   The following portions of the patient's history were reviewed and updated as appropriate: allergies, current medications, past family history, past medical history, past social history, past surgical history and problem list. Problem list updated.  Objective:   Vitals:   06/08/16 1036  BP: 117/67  Pulse: 85  Weight: 185 lb (83.9 kg)    Fetal Status: Fetal Heart Rate (bpm): 145 Fundal Height: 31 cm Movement: Present     General:  Alert, oriented and cooperative. Patient is in no acute distress.  Skin: Skin is warm and dry. No rash noted.   Cardiovascular: Normal heart rate noted  Respiratory: Normal respiratory effort, no problems with respiration noted  Abdomen: Soft, gravid, appropriate for gestational age. Pain/Pressure: Absent     Pelvic:  Cervical exam deferred        Extremities: Normal range of motion.  Edema: Trace  Mental Status: Normal mood and affect. Normal behavior. Normal judgment and thought content.   Assessment and Plan:  Pregnancy: [id number] at [redacted]w[redacted]d Supervision of other normal pregnancy, antepartum Doing well. , has gained 10 lbs since last visit, 24lb weight gain so far this pregnancy.    Preterm labor symptoms and general obstetric precautions including but not limited to vaginal bleeding, contractions, leaking of fluid and fetal movement were reviewed in detail with the patient. Please refer to After Visit Summary for other counseling recommendations.  2wk  ROB  Roe Coombs, [name]

## 2016-06-12 ENCOUNTER — Other Ambulatory Visit: Payer: Self-pay | Admitting: Certified Nurse Midwife

## 2016-06-12 DIAGNOSIS — Z3480 Encounter for supervision of other normal pregnancy, unspecified trimester: Secondary | ICD-10-CM

## 2016-06-20 ENCOUNTER — Encounter: Payer: Self-pay | Admitting: Certified Nurse Midwife

## 2016-06-20 ENCOUNTER — Ambulatory Visit (INDEPENDENT_AMBULATORY_CARE_PROVIDER_SITE_OTHER): Payer: Medicaid Other | Admitting: Certified Nurse Midwife

## 2016-06-20 VITALS — BP 118/74 | HR 93 | Wt 188.0 lb

## 2016-06-20 DIAGNOSIS — Q839 Congenital malformation of breast, unspecified: Secondary | ICD-10-CM | POA: Insufficient documentation

## 2016-06-20 DIAGNOSIS — Z3483 Encounter for supervision of other normal pregnancy, third trimester: Secondary | ICD-10-CM

## 2016-06-20 DIAGNOSIS — Z3480 Encounter for supervision of other normal pregnancy, unspecified trimester: Secondary | ICD-10-CM

## 2016-06-20 DIAGNOSIS — Z803 Family history of malignant neoplasm of breast: Secondary | ICD-10-CM

## 2016-06-20 NOTE — Progress Notes (Signed)
Pt states a few irregular ctx/.

## 2016-06-20 NOTE — Progress Notes (Signed)
   PRENATAL VISIT NOTE  Subjective:  Ann Dunn is a 26 y.o. [phone] at [redacted]w[redacted]d being seen today for ongoing prenatal care.  She is currently monitored for the following issues for this low-risk pregnancy and has Asthma; Supervision of normal pregnancy, antepartum; and Low set nipples on her problem list.  Patient reports no complaints.  Contractions: Irregular. Vag. Bleeding: None.  Movement: Present. Denies leaking of fluid.   The following portions of the patient's history were reviewed and updated as appropriate: allergies, current medications, past family history, past medical history, past social history, past surgical history and problem list. Problem list updated.  Objective:   Vitals:   06/20/16 1451  BP: 118/74  Pulse: 93  Weight: 188 lb (85.3 kg)    Fetal Status: Fetal Heart Rate (bpm): 130 Fundal Height: 32 cm Movement: Present     General:  Alert, oriented and cooperative. Patient is in no acute distress.  Skin: Skin is warm and dry. No rash noted.   Breast:  Bilateral inverted nipples, right side more inset than the left side, no palpable masses noted, no discharge  Cardiovascular: Normal heart rate noted  Respiratory: Normal respiratory effort, no problems with respiration noted  Abdomen: Soft, gravid, appropriate for gestational age. Pain/Pressure: Present     Pelvic:  Cervical exam deferred        Extremities: Normal range of motion.     Mental Status: Normal mood and affect. Normal behavior. Normal judgment and thought content.   Assessment and Plan:  Pregnancy: [id number] at [redacted]w[redacted]d  1. Low set nipples     Inverted nipples bilaterally - Ambulatory referral to Lactation  2. Supervision of other normal pregnancy, antepartum       Preterm labor symptoms and general obstetric precautions including but not limited to vaginal bleeding, contractions, leaking of fluid and fetal movement were reviewed in detail with the patient. Please refer to After Visit Summary for  other counseling recommendations.  Return in about 2 weeks (around 07/04/2016) for ROB.   Roe Coombs, [name]

## 2016-06-22 ENCOUNTER — Telehealth: Payer: Self-pay | Admitting: Certified Nurse Midwife

## 2016-06-22 ENCOUNTER — Inpatient Hospital Stay (HOSPITAL_COMMUNITY)
Admission: AD | Admit: 2016-06-22 | Discharge: 2016-06-22 | Disposition: A | Discharge status: Home / Self Care | Payer: Medicaid Other | Source: Ambulatory Visit | Attending: Obstetrics and Gynecology | Admitting: Obstetrics and Gynecology

## 2016-06-22 ENCOUNTER — Encounter (HOSPITAL_COMMUNITY): Payer: Self-pay | Admitting: *Deleted

## 2016-06-22 DIAGNOSIS — E282 Polycystic ovarian syndrome: Secondary | ICD-10-CM | POA: Diagnosis not present

## 2016-06-22 DIAGNOSIS — Z9889 Other specified postprocedural states: Secondary | ICD-10-CM | POA: Insufficient documentation

## 2016-06-22 DIAGNOSIS — Z823 Family history of stroke: Secondary | ICD-10-CM | POA: Insufficient documentation

## 2016-06-22 DIAGNOSIS — Z87891 Personal history of nicotine dependence: Secondary | ICD-10-CM | POA: Insufficient documentation

## 2016-06-22 DIAGNOSIS — O99513 Diseases of the respiratory system complicating pregnancy, third trimester: Secondary | ICD-10-CM | POA: Insufficient documentation

## 2016-06-22 DIAGNOSIS — J45909 Unspecified asthma, uncomplicated: Secondary | ICD-10-CM | POA: Diagnosis not present

## 2016-06-22 DIAGNOSIS — O99283 Endocrine, nutritional and metabolic diseases complicating pregnancy, third trimester: Secondary | ICD-10-CM | POA: Diagnosis not present

## 2016-06-22 DIAGNOSIS — O26893 Other specified pregnancy related conditions, third trimester: Secondary | ICD-10-CM

## 2016-06-22 DIAGNOSIS — O26853 Spotting complicating pregnancy, third trimester: Secondary | ICD-10-CM | POA: Insufficient documentation

## 2016-06-22 DIAGNOSIS — Z3A32 32 weeks gestation of pregnancy: Secondary | ICD-10-CM | POA: Insufficient documentation

## 2016-06-22 DIAGNOSIS — N898 Other specified noninflammatory disorders of vagina: Secondary | ICD-10-CM | POA: Insufficient documentation

## 2016-06-22 DIAGNOSIS — Z91040 Latex allergy status: Secondary | ICD-10-CM | POA: Insufficient documentation

## 2016-06-22 DIAGNOSIS — Z8759 Personal history of other complications of pregnancy, childbirth and the puerperium: Secondary | ICD-10-CM | POA: Diagnosis not present

## 2016-06-22 DIAGNOSIS — Z803 Family history of malignant neoplasm of breast: Secondary | ICD-10-CM | POA: Diagnosis not present

## 2016-06-22 DIAGNOSIS — R102 Pelvic and perineal pain: Secondary | ICD-10-CM | POA: Diagnosis present

## 2016-06-22 HISTORY — DX: Anemia, unspecified: D64.9

## 2016-06-22 LAB — URINALYSIS, ROUTINE W REFLEX MICROSCOPIC
BILIRUBIN URINE: NEGATIVE
Glucose, UA: NEGATIVE mg/dL
HGB URINE DIPSTICK: NEGATIVE
KETONES UR: NEGATIVE mg/dL
Leukocytes, UA: NEGATIVE
Nitrite: NEGATIVE
PH: 6 (ref 5.0–8.0)
Protein, ur: NEGATIVE mg/dL
SPECIFIC GRAVITY, URINE: 1.014 (ref 1.005–1.030)

## 2016-06-22 NOTE — Discharge Instructions (Signed)
Braxton Hicks Contractions Contractions of the uterus can occur throughout pregnancy, but they are not always a sign that you are in labor. You may have practice contractions called Braxton Hicks contractions. These false labor contractions are sometimes confused with true labor. What are Braxton Hicks contractions? Braxton Hicks contractions are tightening movements that occur in the muscles of the uterus before labor. Unlike true labor contractions, these contractions do not result in opening (dilation) and thinning of the cervix. Toward the end of pregnancy (32-34 weeks), Braxton Hicks contractions can happen more often and may become stronger. These contractions are sometimes difficult to tell apart from true labor because they can be very uncomfortable. You should not feel embarrassed if you go to the hospital with false labor. Sometimes, the only way to tell if you are in true labor is for your health care provider to look for changes in the cervix. The health care provider will do a physical exam and may monitor your contractions. If you are not in true labor, the exam should show that your cervix is not dilating and your water has not broken. If there are no prenatal problems or other health problems associated with your pregnancy, it is completely safe for you to be sent home with false labor. You may continue to have Braxton Hicks contractions until you go into true labor. How can I tell the difference between true labor and false labor?  Differences  False labor  Contractions last 30-70 seconds.: Contractions are usually shorter and not as strong as true labor contractions.  Contractions become very regular.: Contractions are usually irregular.  Discomfort is usually felt in the top of the uterus, and it spreads to the lower abdomen and low back.: Contractions are often felt in the front of the lower abdomen and in the groin.  Contractions do not go away with walking.: Contractions may  go away when you walk around or change positions while lying down.  Contractions usually become more intense and increase in frequency.: Contractions get weaker and are shorter-lasting as time goes on.  The cervix dilates and gets thinner.: The cervix usually does not dilate or become thin. Follow these instructions at home:  Take over-the-counter and prescription medicines only as told by your health care provider.  Keep up with your usual exercises and follow other instructions from your health care provider.  Eat and drink lightly if you think you are going into labor.  If Braxton Hicks contractions are making you uncomfortable:  Change your position from lying down or resting to walking, or change from walking to resting.  Sit and rest in a tub of warm water.  Drink enough fluid to keep your urine clear or pale yellow. Dehydration may cause these contractions.  Do slow and deep breathing several times an hour.  Keep all follow-up prenatal visits as told by your health care provider. This is important. Contact a health care provider if:  You have a fever.  You have continuous pain in your abdomen. Get help right away if:  Your contractions become stronger, more regular, and closer together.  You have fluid leaking or gushing from your vagina.  You pass blood-tinged mucus (bloody show).  You have bleeding from your vagina.  You have low back pain that you never had before.  You feel your baby's head pushing down and causing pelvic pressure.  Your baby is not moving inside you as much as it used to. Summary  Contractions that occur before labor are   called Braxton Hicks contractions, false labor, or practice contractions.  Braxton Hicks contractions are usually shorter, weaker, farther apart, and less regular than true labor contractions. True labor contractions usually become progressively stronger and regular and they become more frequent.  Manage discomfort from  Braxton Hicks contractions by changing position, resting in a warm bath, drinking plenty of water, or practicing deep breathing. This information is not intended to replace advice given to you by your health care provider. Make sure you discuss any questions you have with your health care provider. Document Released: 02/06/2005 Document Revised: 12/27/2015 Document Reviewed: 12/27/2015 Else[employer] Interactive Patient Education  2017 [employer] Third Trimester of Pregnancy The third trimester is from week 28 through week 40 (months 7 through 9). The third trimester is a time when the unborn baby (fetus) is growing rapidly. At the end of the ninth month, the fetus is about 20 inches in length and weighs 6-10 pounds. Body changes during your third trimester Your body will continue to go through many changes during pregnancy. The changes vary from woman to woman. During the third trimester:  Your weight will continue to increase. You can expect to gain 25-35 pounds (11-16 kg) by the end of the pregnancy.  You may begin to get stretch marks on your hips, abdomen, and breasts.  You may urinate more often because the fetus is moving lower into your pelvis and pressing on your bladder.  You may develop or continue to have heartburn. This is caused by increased hormones that slow down muscles in the digestive tract.  You may develop or continue to have constipation because increased hormones slow digestion and cause the muscles that push waste through your intestines to relax.  You may develop hemorrhoids. These are swollen veins (varicose veins) in the rectum that can itch or be painful.  You may develop swollen, bulging veins (varicose veins) in your legs.  You may have increased body aches in the pelvis, back, or thighs. This is due to weight gain and increased hormones that are relaxing your joints.  You may have changes in your hair. These can include thickening of your hair, rapid growth, and  changes in texture. Some women also have hair loss during or after pregnancy, or hair that feels dry or thin. Your hair will most likely return to normal after your baby is born.  Your breasts will continue to grow and they will continue to become tender. A yellow fluid (colostrum) may leak from your breasts. This is the first milk you are producing for your baby.  Your belly button may stick out.  You may notice more swelling in your hands, face, or ankles.  You may have increased tingling or numbness in your hands, arms, and legs. The skin on your belly may also feel numb.  You may feel short of breath because of your expanding uterus.  You may have more problems sleeping. This can be caused by the size of your belly, increased need to urinate, and an increase in your body's metabolism.  You may notice the fetus "dropping," or moving lower in your abdomen (lightening).  You may have increased vaginal discharge.  You may notice your joints feel loose and you may have pain around your pelvic bone. What to expect at prenatal visits You will have prenatal exams every 2 weeks until week 36. Then you will have weekly prenatal exams. During a routine prenatal visit:  You will be weighed to make sure you and the baby   are growing normally.  Your blood pressure will be taken.  Your abdomen will be measured to track your baby's growth.  The fetal heartbeat will be listened to.  Any test results from the previous visit will be discussed.  You may have a cervical check near your due date to see if your cervix has softened or thinned (effaced).  You will be tested for Group B streptococcus. This happens between 35 and 37 weeks. Your health care provider may ask you:  What your birth plan is.  How you are feeling.  If you are feeling the baby move.  If you have had any abnormal symptoms, such as leaking fluid, bleeding, severe headaches, or abdominal cramping.  If you are using any  tobacco products, including cigarettes, chewing tobacco, and electronic cigarettes.  If you have any questions. Other tests or screenings that may be performed during your third trimester include:  Blood tests that check for low iron levels (anemia).  Fetal testing to check the health, activity level, and growth of the fetus. Testing is done if you have certain medical conditions or if there are problems during the pregnancy.  Nonstress test (NST). This test checks the health of your baby to make sure there are no signs of problems, such as the baby not getting enough oxygen. During this test, a belt is placed around your belly. The baby is made to move, and its heart rate is monitored during movement. What is false labor? False labor is a condition in which you feel small, irregular tightenings of the muscles in the womb (contractions) that usually go away with rest, changing position, or drinking water. These are called Braxton Hicks contractions. Contractions may last for hours, days, or even weeks before true labor sets in. If contractions come at regular intervals, become more frequent, increase in intensity, or become painful, you should see your health care provider. What are the signs of labor?  Abdominal cramps.  Regular contractions that start at 10 minutes apart and become stronger and more frequent with time.  Contractions that start on the top of the uterus and spread down to the lower abdomen and back.  Increased pelvic pressure and dull back pain.  A watery or bloody mucus discharge that comes from the vagina.  Leaking of amniotic fluid. This is also known as your "water breaking." It could be a slow trickle or a gush. Let your health care provider know if it has a color or strange odor. If you have any of these signs, call your health care provider right away, even if it is before your due date. Follow these instructions at home: Medicines   Follow your health care  provider's instructions regarding medicine use. Specific medicines may be either safe or unsafe to take during pregnancy.  Take a prenatal vitamin that contains at least 600 micrograms (mcg) of folic acid.  If you develop constipation, try taking a stool softener if your health care provider approves. Eating and drinking   Eat a balanced diet that includes fresh fruits and vegetables, whole grains, good sources of protein such as meat, eggs, or tofu, and low-fat dairy. Your health care provider will help you determine the amount of weight gain that is right for you.  Avoid raw meat and uncooked cheese. These carry germs that can cause birth defects in the baby.  If you have low calcium intake from food, talk to your health care provider about whether you should take a daily calcium supplement.    Eat four or five small meals rather than three large meals a day.  Limit foods that are high in fat and processed sugars, such as fried and sweet foods.  To prevent constipation:  Drink enough fluid to keep your urine clear or pale yellow.  Eat foods that are high in fiber, such as fresh fruits and vegetables, whole grains, and beans. Activity   Exercise only as directed by your health care provider. Most women can continue their usual exercise routine during pregnancy. Try to exercise for 30 minutes at least 5 days a week. Stop exercising if you experience uterine contractions.  Avoid heavy lifting.  Do not exercise in extreme heat or humidity, or at high altitudes.  Wear low-heel, comfortable shoes.  Practice good posture.  You may continue to have sex unless your health care provider tells you otherwise. Relieving pain and discomfort   Take frequent breaks and rest with your legs elevated if you have leg cramps or low back pain.  Take warm sitz baths to soothe any pain or discomfort caused by hemorrhoids. Use hemorrhoid cream if your health care provider approves.  Wear a good  support bra to prevent discomfort from breast tenderness.  If you develop varicose veins:  Wear support pantyhose or compression stockings as told by your healthcare provider.  Elevate your feet for 15 minutes, 3-4 times a day. Prenatal care   Write down your questions. Take them to your prenatal visits.  Keep all your prenatal visits as told by your health care provider. This is important. Safety   Wear your seat belt at all times when driving.  Make a list of emergency phone numbers, including numbers for family, friends, the hospital, and police and fire departments. General instructions   Avoid cat litter boxes and soil used by cats. These carry germs that can cause birth defects in the baby. If you have a cat, ask someone to clean the litter box for you.  Do not travel far distances unless it is absolutely necessary and only with the approval of your health care provider.  Do not use hot tubs, steam rooms, or saunas.  Do not drink alcohol.  Do not use any products that contain nicotine or tobacco, such as cigarettes and e-cigarettes. If you need help quitting, ask your health care provider.  Do not use any medicinal herbs or unprescribed drugs. These chemicals affect the formation and growth of the baby.  Do not douche or use tampons or scented sanitary pads.  Do not cross your legs for long periods of time.  To prepare for the arrival of your baby:  Take prenatal classes to understand, practice, and ask questions about labor and delivery.  Make a trial run to the hospital.  Visit the hospital and tour the maternity area.  Arrange for maternity or paternity leave through employers.  Arrange for family and friends to take care of pets while you are in the hospital.  Purchase a rear-facing car seat and make sure you know how to install it in your car.  Pack your hospital bag.  Prepare the baby's nursery. Make sure to remove all pillows and stuffed animals from  the baby's crib to prevent suffocation.  Visit your dentist if you have not gone during your pregnancy. Use a soft toothbrush to brush your teeth and be gentle when you floss. Contact a health care provider if:  You are unsure if you are in labor or if your water has broken.  You   become dizzy.  You have mild pelvic cramps, pelvic pressure, or nagging pain in your abdominal area.  You have lower back pain.  You have persistent nausea, vomiting, or diarrhea.  You have an unusual or bad smelling vaginal discharge.  You have pain when you urinate. Get help right away if:  Your water breaks before 37 weeks.  You have regular contractions less than 5 minutes apart before 37 weeks.  You have a fever.  You are leaking fluid from your vagina.  You have spotting or bleeding from your vagina.  You have severe abdominal pain or cramping.  You have rapid weight loss or weight gain.  You have shortness of breath with chest pain.  You notice sudden or extreme swelling of your face, hands, ankles, feet, or legs.  Your baby makes fewer than 10 movements in 2 hours.  You have severe headaches that do not go away when you take medicine.  You have vision changes. Summary  The third trimester is from week 28 through week 40, months 7 through 9. The third trimester is a time when the unborn baby (fetus) is growing rapidly.  During the third trimester, your discomfort may increase as you and your baby continue to gain weight. You may have abdominal, leg, and back pain, sleeping problems, and an increased need to urinate.  During the third trimester your breasts will keep growing and they will continue to become tender. A yellow fluid (colostrum) may leak from your breasts. This is the first milk you are producing for your baby.  False labor is a condition in which you feel small, irregular tightenings of the muscles in the womb (contractions) that eventually go away. These are called  Braxton Hicks contractions. Contractions may last for hours, days, or even weeks before true labor sets in.  Signs of labor can include: abdominal cramps; regular contractions that start at 10 minutes apart and become stronger and more frequent with time; watery or bloody mucus discharge that comes from the vagina; increased pelvic pressure and dull back pain; and leaking of amniotic fluid. This information is not intended to replace advice given to you by your health care provider. Make sure you discuss any questions you have with your health care provider. Document Released: 01/31/2001 Document Revised: 07/15/2015 Document Reviewed: 04/09/2012 [employer] Interactive Patient Education  2017 [employer]  

## 2016-06-22 NOTE — MAU Provider Note (Signed)
History     CSN: [id number]  Arrival date and time: 06/22/16 1326   First Provider Initiated Contact with Patient 06/22/16 1424      Chief Complaint  Patient presents with  . mucous d/c  . pelvic pressure   HPI Ann Dunn is a 26 y.o. [id number] at [redacted]w[redacted]d who presents stating she lost her mucous plug last night and some today, but today's mucous was pink tinged. She reports irregular contractions and pelvic pain that she rates a 6/10 and has not tried anything to treat it. Denies any vaginal bleeding or leaking of fluid. Reports good fetal movement.   OB History    Gravida Para Term Preterm AB Living   5 2 2   2 2    SAB TAB Ectopic Multiple Live Births   1 1   0 2      Past Medical History:  Diagnosis Date  . Anemia   . Asthma    Triggered by grass and pollen;inhaler prn  . H/O induced abortion 11/27/2011  . H/O spontaneous abortion, currently pregnant 11/27/2011   SAB in Feb 2013  . Headache(784.0)   . PCOS (polycystic ovarian syndrome)   . Vertigo    Not officially dx'd    Past Surgical History:  Procedure Laterality Date  . ARM WOUND REPAIR / CLOSURE    . INDUCED ABORTION      Family History  Problem Relation Age of Onset  . Cancer Maternal Grandmother     2016 breast  . Hyperlipidemia Maternal Grandmother   . Stroke Maternal Grandmother     great grandma    Social History  Substance Use Topics  . Smoking status: Former Smoker    Packs/day: 0.00    Types: Cigarettes    Quit date: 08/13/2011  . Smokeless tobacco: Never Used  . Alcohol use No     Comment: not since college    Allergies:  Allergies  Allergen Reactions  . Latex Rash    Prescriptions Prior to Admission  Medication Sig Dispense Refill Last Dose  . Prenat-FeFmCb-DSS-FA-DHA w/o A (CITRANATAL HARMONY) 27-1-260 MG CAPS Take 1 tablet by mouth daily. 30 capsule 11 Taking    Review of Systems  Constitutional: Negative.   Respiratory: Negative.   Cardiovascular: Negative.    Gastrointestinal: Positive for abdominal pain. Negative for constipation, diarrhea, nausea and vomiting.  Genitourinary: Positive for vaginal bleeding and vaginal discharge.  Psychiatric/Behavioral: Negative.    Physical Exam   Blood pressure 119/63, pulse 72, temperature 98.3 F (36.8 C), temperature source Oral, resp. rate 18, weight 193 lb 4 oz (87.7 kg), last menstrual period 10/26/2015, SpO2 97 %, unknown if currently breastfeeding.  Physical Exam  Nursing note and vitals reviewed. Constitutional: She is oriented to person, place, and time. She appears well-developed and well-nourished.  HENT:  Head: Normocephalic and atraumatic.  Eyes: Conjunctivae are normal. No scleral icterus.  Cardiovascular: Normal rate and regular rhythm.   Respiratory: Effort normal. No respiratory distress.  GI: Soft. There is no tenderness.  Neurological: She is alert and oriented to person, place, and time.  Skin: Skin is warm and dry.  Psychiatric: She has a normal mood and affect. Her behavior is normal. Judgment and thought content normal.   Pelvic exam: Cervix pink, visually closed, without lesion, scant white creamy discharge, vaginal walls and external genitalia normal Bimanual exam: Cervix 0/long/high, soft  Fetal Tracing:  Baseline: 120 Variability: moderate Accelerations: 15x15 Decelerations: none  Toco: 2 uc's lasting 60-100 seconds  MAU Course  Procedures Results for orders placed or performed during the hospital encounter of 06/22/16 (from the past 24 hour(s))  Urinalysis, Routine w reflex microscopic     Status: None   Collection Time: 06/22/16  1:10 PM  Result Value Ref Range   Color, Urine YELLOW YELLOW   APPearance CLEAR CLEAR   Specific Gravity, Urine 1.014 1.005 - 1.030   pH 6.0 5.0 - 8.0   Glucose, UA NEGATIVE NEGATIVE mg/dL   Hgb urine dipstick NEGATIVE NEGATIVE   Bilirubin Urine NEGATIVE NEGATIVE   Ketones, ur NEGATIVE NEGATIVE mg/dL   Protein, ur NEGATIVE  NEGATIVE mg/dL   Nitrite NEGATIVE NEGATIVE   Leukocytes, UA NEGATIVE NEGATIVE    MDM Pelvic exam, cervical exam Reactive fetal tracing Assessment and Plan   1. Vaginal discharge during pregnancy in third trimester   2. Spotting affecting pregnancy in third trimester    Discharge home. Reviewed preterm labor signs and symptoms. Reviewed when to return to MAU. [facility].   Cleone SlimCaroline Neill 06/22/2016, 2:38 PM     I confirm that I have verified the information documented in the nurse midwife's note and that I have also personally reperformed the physical exam and all medical decision making activities. Reactive fetal tracing Cervix closed/thick No blood on exam  Judeth HornErin Dalisha Shively, NP

## 2016-06-22 NOTE — MAU Note (Signed)
Started losing mucous plug  Last night, little tinge of pink in the mucous today.  Denies leaking or contractions. Is having an increase in pelvic pressure.  Denies hx of PTL or PTD

## 2016-07-04 ENCOUNTER — Ambulatory Visit (INDEPENDENT_AMBULATORY_CARE_PROVIDER_SITE_OTHER): Payer: Medicaid Other | Admitting: Certified Nurse Midwife

## 2016-07-04 ENCOUNTER — Encounter: Payer: Self-pay | Admitting: Certified Nurse Midwife

## 2016-07-04 DIAGNOSIS — Z3483 Encounter for supervision of other normal pregnancy, third trimester: Secondary | ICD-10-CM

## 2016-07-04 DIAGNOSIS — Z3480 Encounter for supervision of other normal pregnancy, unspecified trimester: Secondary | ICD-10-CM

## 2016-07-04 NOTE — Progress Notes (Signed)
   PRENATAL VISIT NOTE  Subjective:  Ann Dunn is a 26 y.o. [phone] at 7247w2d being seen today for ongoing prenatal care.  She is currently monitored for the following issues for this low-risk pregnancy and has Asthma; Supervision of normal pregnancy, antepartum; Low set nipples; and Family history of breast cancer on her problem list.  Patient reports no complaints.  Contractions: Irregular. Vag. Bleeding: None.  Movement: Present. Denies leaking of fluid.   The following portions of the patient's history were reviewed and updated as appropriate: allergies, current medications, past family history, past medical history, past social history, past surgical history and problem list. Problem list updated.  Objective:   Vitals:   07/04/16 1557  BP: 115/73  Pulse: 93  Weight: 196 lb 14.4 oz (89.3 kg)    Fetal Status: Fetal Heart Rate (bpm): 145 Fundal Height: 35 cm Movement: Present     General:  Alert, oriented and cooperative. Patient is in no acute distress.  Skin: Skin is warm and dry. No rash noted.   Cardiovascular: Normal heart rate noted  Respiratory: Normal respiratory effort, no problems with respiration noted  Abdomen: Soft, gravid, appropriate for gestational age. Pain/Pressure: Present     Pelvic:  Cervical exam deferred        Extremities: Normal range of motion.  Edema: Trace  Mental Status: Normal mood and affect. Normal behavior. Normal judgment and thought content.   Assessment and Plan:  Pregnancy: [id number] at 2847w2d  1. Supervision of other normal pregnancy, antepartum      Doing well.  Preterm labor symptoms and general obstetric precautions including but not limited to vaginal bleeding, contractions, leaking of fluid and fetal movement were reviewed in detail with the patient. Please refer to After Visit Summary for other counseling recommendations.  Return in about 1 week (around 07/11/2016) for ROB, GBS.   Roe Coombsenney, Rachelle A, [name]

## 2016-07-04 NOTE — Progress Notes (Signed)
Patient is in the office, reports good fetal movement and irregular contractions. 

## 2016-07-19 ENCOUNTER — Encounter: Payer: Medicaid Other | Admitting: Certified Nurse Midwife

## 2016-07-20 ENCOUNTER — Ambulatory Visit (INDEPENDENT_AMBULATORY_CARE_PROVIDER_SITE_OTHER): Payer: Medicaid Other | Admitting: Certified Nurse Midwife

## 2016-07-20 ENCOUNTER — Other Ambulatory Visit (HOSPITAL_COMMUNITY)
Admission: RE | Admit: 2016-07-20 | Discharge: 2016-07-20 | Disposition: A | Discharge status: Home / Self Care | Payer: Medicaid Other | Source: Ambulatory Visit | Attending: Certified Nurse Midwife | Admitting: Certified Nurse Midwife

## 2016-07-20 ENCOUNTER — Encounter: Payer: Self-pay | Admitting: Certified Nurse Midwife

## 2016-07-20 VITALS — BP 113/73 | HR 89 | Wt 201.0 lb

## 2016-07-20 DIAGNOSIS — Z3480 Encounter for supervision of other normal pregnancy, unspecified trimester: Secondary | ICD-10-CM

## 2016-07-20 DIAGNOSIS — Z3483 Encounter for supervision of other normal pregnancy, third trimester: Secondary | ICD-10-CM

## 2016-07-20 LAB — OB RESULTS CONSOLE GC/CHLAMYDIA: GC PROBE AMP, GENITAL: NEGATIVE

## 2016-07-20 LAB — OB RESULTS CONSOLE GBS: GBS: NEGATIVE

## 2016-07-20 NOTE — Progress Notes (Signed)
   PRENATAL VISIT NOTE  Subjective:  Ann Dunn is a 26 y.o. [phone] at 3663w4d being seen today for ongoing prenatal care.  She is currently monitored for the following issues for this low-risk pregnancy and has Asthma; Supervision of normal pregnancy, antepartum; Low set nipples; and Family history of breast cancer on her problem list.  Patient reports no bleeding, no leaking, occasional contractions and no contractions while in room or while checking her cervix, Tylenol given along with water.  Contractions: Irregular.  .  Movement: Present. Denies leaking of fluid.   The following portions of the patient's history were reviewed and updated as appropriate: allergies, current medications, past family history, past medical history, past social history, past surgical history and problem list. Problem list updated.  Objective:   Vitals:   07/20/16 1458  BP: 113/73  Pulse: 89  Weight: 201 lb (91.2 kg)    Fetal Status: Fetal Heart Rate (bpm): 141 Fundal Height: 37 cm Movement: Present  Presentation: Vertex  General:  Alert, oriented and cooperative. Patient is in no acute distress.  Skin: Skin is warm and dry. No rash noted.   Cardiovascular: Normal heart rate noted  Respiratory: Normal respiratory effort, no problems with respiration noted  Abdomen: Soft, gravid, appropriate for gestational age. Pain/Pressure: Present     Pelvic:  Cervical exam performed Dilation: Closed Effacement (%): Thick Station: Ballotable  Extremities: Normal range of motion.     Mental Status: Normal mood and affect. Normal behavior. Normal judgment and thought content.   Assessment and Plan:  Pregnancy: [id number] at 4963w4d  1. Supervision of other normal pregnancy, antepartum     Normal discomforts of pregnancy.  - Strep Gp B NAA - Cervicovaginal ancillary only  Preterm labor symptoms and general obstetric precautions including but not limited to vaginal bleeding, contractions, leaking of fluid and fetal  movement were reviewed in detail with the patient. Please refer to After Visit Summary for other counseling recommendations.  Return in about 1 week (around 07/27/2016) for ROB.   Roe Coombsachelle A Jewel Mcafee, [name]

## 2016-07-21 LAB — CERVICOVAGINAL ANCILLARY ONLY
Bacterial vaginitis: NEGATIVE
CANDIDA VAGINITIS: NEGATIVE
Chlamydia: NEGATIVE
Neisseria Gonorrhea: NEGATIVE
TRICH (WINDOWPATH): NEGATIVE

## 2016-07-22 LAB — STREP GP B NAA: Strep Gp B NAA: NEGATIVE

## 2016-07-24 ENCOUNTER — Other Ambulatory Visit: Payer: Self-pay | Admitting: Certified Nurse Midwife

## 2016-07-24 DIAGNOSIS — Z3480 Encounter for supervision of other normal pregnancy, unspecified trimester: Secondary | ICD-10-CM

## 2016-07-26 ENCOUNTER — Encounter (HOSPITAL_COMMUNITY): Payer: Self-pay | Admitting: *Deleted

## 2016-07-26 ENCOUNTER — Inpatient Hospital Stay (HOSPITAL_COMMUNITY)
Admission: AD | Admit: 2016-07-26 | Discharge: 2016-07-26 | Disposition: A | Discharge status: Home / Self Care | Payer: Medicaid Other | Source: Ambulatory Visit | Attending: Family Medicine | Admitting: Family Medicine

## 2016-07-26 DIAGNOSIS — J45909 Unspecified asthma, uncomplicated: Secondary | ICD-10-CM | POA: Diagnosis not present

## 2016-07-26 DIAGNOSIS — Z3A37 37 weeks gestation of pregnancy: Secondary | ICD-10-CM | POA: Insufficient documentation

## 2016-07-26 DIAGNOSIS — O368130 Decreased fetal movements, third trimester, not applicable or unspecified: Secondary | ICD-10-CM | POA: Insufficient documentation

## 2016-07-26 DIAGNOSIS — O368120 Decreased fetal movements, second trimester, not applicable or unspecified: Secondary | ICD-10-CM | POA: Diagnosis not present

## 2016-07-26 DIAGNOSIS — O99513 Diseases of the respiratory system complicating pregnancy, third trimester: Secondary | ICD-10-CM | POA: Insufficient documentation

## 2016-07-26 DIAGNOSIS — Z3689 Encounter for other specified antenatal screening: Secondary | ICD-10-CM

## 2016-07-26 LAB — URINALYSIS, ROUTINE W REFLEX MICROSCOPIC
Bilirubin Urine: NEGATIVE
GLUCOSE, UA: NEGATIVE mg/dL
HGB URINE DIPSTICK: NEGATIVE
KETONES UR: NEGATIVE mg/dL
LEUKOCYTES UA: NEGATIVE
Nitrite: NEGATIVE
PH: 6 (ref 5.0–8.0)
Protein, ur: NEGATIVE mg/dL
Specific Gravity, Urine: 1.009 (ref 1.005–1.030)

## 2016-07-26 NOTE — MAU Provider Note (Signed)
History     CSN: [id number]  Arrival date and time: 07/26/16 1352   First Provider Initiated Contact with Patient 07/26/16 1455      No chief complaint on file.  HPI   Ms.Ann Dunn is a 26 y.o. female [phone] @ [redacted]w[redacted]d here in MAU with decreased fetal movement. She attests to feeling movement since her arrival here.   She denies vaginal bleeding or leaking of fluid.   OB History    Gravida Para Term Preterm AB Living   5 2 2   2 2    SAB TAB Ectopic Multiple Live Births   1 1   0 2      Past Medical History:  Diagnosis Date  . Anemia   . Asthma    Triggered by grass and pollen;inhaler prn  . H/O induced abortion 11/27/2011  . H/O spontaneous abortion, currently pregnant 11/27/2011   SAB in Feb 2013  . Headache(784.0)   . PCOS (polycystic ovarian syndrome)   . Vertigo    Not officially dx'd    Past Surgical History:  Procedure Laterality Date  . ARM WOUND REPAIR / CLOSURE    . INDUCED ABORTION      Family History  Problem Relation Age of Onset  . Cancer Maternal Grandmother        2016 breast  . Hyperlipidemia Maternal Grandmother   . Stroke Maternal Grandmother        great grandma    Social History  Substance Use Topics  . Smoking status: Former Smoker    Packs/day: 0.00    Types: Cigarettes    Quit date: 08/13/2011  . Smokeless tobacco: Never Used  . Alcohol use No     Comment: not since college    Allergies:  Allergies  Allergen Reactions  . Latex Rash    Prescriptions Prior to Admission  Medication Sig Dispense Refill Last Dose  . Prenat-FeFmCb-DSS-FA-DHA w/o A (CITRANATAL HARMONY) 27-1-260 MG CAPS Take 1 tablet by mouth daily. 30 capsule 11 Past Month at Unknown time   Results for orders placed or performed during the hospital encounter of 07/26/16 (from the past 48 hour(s))  Urinalysis, Routine w reflex microscopic     Status: None   Collection Time: 07/26/16  2:07 PM  Result Value Ref Range   Color, Urine YELLOW YELLOW   APPearance  CLEAR CLEAR   Specific Gravity, Urine 1.009 1.005 - 1.030   pH 6.0 5.0 - 8.0   Glucose, UA NEGATIVE NEGATIVE mg/dL   Hgb urine dipstick NEGATIVE NEGATIVE   Bilirubin Urine NEGATIVE NEGATIVE   Ketones, ur NEGATIVE NEGATIVE mg/dL   Protein, ur NEGATIVE NEGATIVE mg/dL   Nitrite NEGATIVE NEGATIVE   Leukocytes, UA NEGATIVE NEGATIVE   Review of Systems  Constitutional: Negative for fever.  Gastrointestinal: Negative for abdominal pain.  Genitourinary: Positive for pelvic pain (With movement. ). Negative for vaginal bleeding and vaginal discharge.   Physical Exam   Blood pressure 115/73, pulse 96, temperature 97.9 F (36.6 C), temperature source Oral, resp. rate 18, weight 202 lb 0.6 oz (91.6 kg), last menstrual period 10/26/2015, SpO2 99 %, unknown if currently breastfeeding.  Physical Exam  Constitutional: She is oriented to person, place, and time. She appears well-developed and well-nourished. No distress.  HENT:  Head: Normocephalic.  Eyes: Pupils are equal, round, and reactive to light.  GI: Soft. She exhibits no distension. There is no tenderness. There is no rebound.  Musculoskeletal: Normal range of motion.  Neurological: She is  alert and oriented to person, place, and time.  Skin: Skin is warm. She is not diaphoretic.  Psychiatric: Her behavior is normal.   Fetal Tracing: Baseline: 125 bpm Variability: Moderate Accelerations: 15x15 Decelerations: none Toco: None  MAU Course  Procedures  None  MDM  Reactive NST UA   Assessment and Plan   A:  1. Decreased fetal movements in second trimester, single or unspecified fetus   2. NST (non-stress test) reactive     P  Discharge home in stable condition Return to MAU if symptoms worsen Follow up with OB as scheduled   Venia Carbonasch, Jennifer I, NP 07/26/2016 5:39 PM

## 2016-07-26 NOTE — MAU Note (Signed)
Patient states she is feeling "minimal movement" No movement since 5am  [facility]  ?+round ligament pain per patient Lower abdominal cramping that radiates to back; worse with ambulation and movement Rating 7/10 Has not taken anything for pain

## 2016-07-26 NOTE — Discharge Instructions (Signed)

## 2016-07-27 ENCOUNTER — Encounter: Payer: Medicaid Other | Admitting: Certified Nurse Midwife

## 2016-08-02 ENCOUNTER — Ambulatory Visit (INDEPENDENT_AMBULATORY_CARE_PROVIDER_SITE_OTHER): Payer: Medicaid Other | Admitting: Certified Nurse Midwife

## 2016-08-02 ENCOUNTER — Encounter: Payer: Self-pay | Admitting: Certified Nurse Midwife

## 2016-08-02 VITALS — BP 122/66 | HR 86 | Wt 206.0 lb

## 2016-08-02 DIAGNOSIS — Q839 Congenital malformation of breast, unspecified: Secondary | ICD-10-CM

## 2016-08-02 DIAGNOSIS — Z3483 Encounter for supervision of other normal pregnancy, third trimester: Secondary | ICD-10-CM

## 2016-08-02 DIAGNOSIS — Z3480 Encounter for supervision of other normal pregnancy, unspecified trimester: Secondary | ICD-10-CM

## 2016-08-02 NOTE — Progress Notes (Signed)
   PRENATAL VISIT NOTE  Subjective:  Ann Dunn is a 26 y.o. [phone] at 6017w3d being seen today for ongoing prenatal care.  She is currently monitored for the following issues for this low-risk pregnancy and has Asthma; Supervision of normal pregnancy, antepartum; Low set nipples; and Family history of breast cancer on her problem list.  Patient reports backache, no bleeding, no leaking and occasional contractions.  Contractions: Irritability. Vag. Bleeding: None.  Movement: Absent. Denies leaking of fluid.   The following portions of the patient's history were reviewed and updated as appropriate: allergies, current medications, past family history, past medical history, past social history, past surgical history and problem list. Problem list updated.  Objective:   Vitals:   08/02/16 1541  BP: 122/66  Pulse: 86  Weight: 206 lb (93.4 kg)    Fetal Status: Fetal Heart Rate (bpm): 152 Fundal Height: 38 cm Movement: Absent  Presentation: Vertex  General:  Alert, oriented and cooperative. Patient is in no acute distress.  Skin: Skin is warm and dry. No rash noted.   Cardiovascular: Normal heart rate noted  Respiratory: Normal respiratory effort, no problems with respiration noted  Abdomen: Soft, gravid, appropriate for gestational age. Pain/Pressure: Present     Pelvic:  Cervical exam performed Dilation: 1.5 Effacement (%): Thick Station: -3  Extremities: Normal range of motion.  Edema: Trace  Mental Status: Normal mood and affect. Normal behavior. Normal judgment and thought content.   Assessment and Plan:  Pregnancy: [id number] at 2317w3d  1. Supervision of other normal pregnancy, antepartum     Significant change from last week in cervical exam.    2. Low set nipples        Term labor symptoms and general obstetric precautions including but not limited to vaginal bleeding, contractions, leaking of fluid and fetal movement were reviewed in detail with the patient. Please refer to  After Visit Summary for other counseling recommendations.  Return in about 1 week (around 08/09/2016) for ROB.   Roe Coombsachelle A Elvis Boot, [name]

## 2016-08-02 NOTE — Progress Notes (Signed)
Patient wants her membranes stripped. 

## 2016-08-03 ENCOUNTER — Encounter: Payer: Medicaid Other | Admitting: Certified Nurse Midwife

## 2016-08-04 ENCOUNTER — Inpatient Hospital Stay (HOSPITAL_COMMUNITY)
Admission: AD | Admit: 2016-08-04 | Discharge: 2016-08-05 | Disposition: A | Discharge status: Home / Self Care | Payer: Medicaid Other | Source: Ambulatory Visit | Attending: Obstetrics and Gynecology | Admitting: Obstetrics and Gynecology

## 2016-08-04 DIAGNOSIS — Z3A39 39 weeks gestation of pregnancy: Secondary | ICD-10-CM | POA: Insufficient documentation

## 2016-08-04 DIAGNOSIS — O479 False labor, unspecified: Secondary | ICD-10-CM

## 2016-08-05 DIAGNOSIS — O479 False labor, unspecified: Secondary | ICD-10-CM | POA: Diagnosis present

## 2016-08-05 DIAGNOSIS — Z3A39 39 weeks gestation of pregnancy: Secondary | ICD-10-CM | POA: Diagnosis not present

## 2016-08-05 LAB — AMNISURE RUPTURE OF MEMBRANE (ROM) NOT AT ARMC: AMNISURE: NEGATIVE

## 2016-08-05 NOTE — MAU Note (Signed)
I have communicated with L.Leftwich-Kirby,[name] and reviewed vital signs:  Vitals:   08/05/16 0013 08/05/16 0120  BP: 124/80 122/74  Pulse: 85 75  Resp:  18  Temp:      Vaginal exam:  Dilation: 2 Effacement (%): Thick, 40 Cervical Position: Posterior Station: -3 Presentation: Vertex Exam by:: K.Arias Weinert,RN,   Also reviewed contraction pattern and that non-stress test is reactive.  It has been documented that patient is having irregular contractions 3-12 minutes with no cervical change since office exam on Thursday not indicating active labor. Amniosure was negative and  patient denies any other complaints.  Based on this report provider has given order for discharge.  A discharge order and diagnosis entered by a provider.   Labor discharge instructions reviewed with patient.

## 2016-08-05 NOTE — MAU Note (Signed)
Pt reports she had some clear liquid leak out tonight. Reports some ctx earlier in the day but nothing regular. Good fetal movement felt

## 2016-08-09 ENCOUNTER — Other Ambulatory Visit: Payer: Self-pay | Admitting: Certified Nurse Midwife

## 2016-08-09 ENCOUNTER — Ambulatory Visit (INDEPENDENT_AMBULATORY_CARE_PROVIDER_SITE_OTHER): Payer: Medicaid Other | Admitting: Certified Nurse Midwife

## 2016-08-09 VITALS — BP 134/83 | HR 83 | Wt 204.0 lb

## 2016-08-09 DIAGNOSIS — Z3480 Encounter for supervision of other normal pregnancy, unspecified trimester: Secondary | ICD-10-CM

## 2016-08-09 DIAGNOSIS — Z3483 Encounter for supervision of other normal pregnancy, third trimester: Secondary | ICD-10-CM

## 2016-08-09 NOTE — Progress Notes (Signed)
   PRENATAL VISIT NOTE  Subjective:  Wynn Maudlinyerra D Dresch is a 26 y.o. [phone] at 4067w3d being seen today for ongoing prenatal care.  She is currently monitored for the following issues for this low-risk pregnancy and has Asthma; Supervision of normal pregnancy, antepartum; Low set nipples; and Family history of breast cancer on her problem list.  Patient reports no bleeding, no leaking and occasional contractions.  Contractions: Irregular. Vag. Bleeding: None.  Movement: Present. Denies leaking of fluid.   The following portions of the patient's history were reviewed and updated as appropriate: allergies, current medications, past family history, past medical history, past social history, past surgical history and problem list. Problem list updated.  Objective:   Vitals:   08/09/16 1610  BP: 134/83  Pulse: 83  Weight: 204 lb (92.5 kg)    Fetal Status: Fetal Heart Rate (bpm): 137 Fundal Height: 39 cm Movement: Present  Presentation: Vertex  General:  Alert, oriented and cooperative. Patient is in no acute distress.  Skin: Skin is warm and dry. No rash noted.   Cardiovascular: Normal heart rate noted  Respiratory: Normal respiratory effort, no problems with respiration noted  Abdomen: Soft, gravid, appropriate for gestational age. Pain/Pressure: Present     Pelvic:  Cervical exam performed Dilation: 1.5 Effacement (%): Thick Station: -3  Extremities: Normal range of motion.  Edema: Trace  Mental Status: Normal mood and affect. Normal behavior. Normal judgment and thought content.   Assessment and Plan:  Pregnancy: [id number] at 5167w3d  1. Supervision of other normal pregnancy, antepartum      Cervix unchanged from previous exam.  IOL scheduled  Term labor symptoms and general obstetric precautions including but not limited to vaginal bleeding, contractions, leaking of fluid and fetal movement were reviewed in detail with the patient. Please refer to After Visit Summary for other counseling  recommendations.  Return in about 1 week (around 08/16/2016) for ROB, NST.   Roe Coombsachelle A Denney, [name]

## 2016-08-09 NOTE — Progress Notes (Signed)
Patient reports she has some SOB- she had swelling in her L leg yesterday that lasted 2-3 hours.

## 2016-08-11 ENCOUNTER — Encounter (HOSPITAL_COMMUNITY): Payer: Self-pay | Admitting: *Deleted

## 2016-08-11 ENCOUNTER — Telehealth (HOSPITAL_COMMUNITY): Payer: Self-pay | Admitting: *Deleted

## 2016-08-11 NOTE — Telephone Encounter (Signed)
Preadmission screen  

## 2016-08-16 ENCOUNTER — Ambulatory Visit (INDEPENDENT_AMBULATORY_CARE_PROVIDER_SITE_OTHER): Payer: Medicaid Other | Admitting: Certified Nurse Midwife

## 2016-08-16 VITALS — BP 132/76 | HR 91 | Wt 204.0 lb

## 2016-08-16 DIAGNOSIS — Z3480 Encounter for supervision of other normal pregnancy, unspecified trimester: Secondary | ICD-10-CM

## 2016-08-16 DIAGNOSIS — Z3483 Encounter for supervision of other normal pregnancy, third trimester: Secondary | ICD-10-CM | POA: Diagnosis not present

## 2016-08-16 NOTE — Progress Notes (Signed)
   PRENATAL VISIT NOTE  Subjective:  Ann Dunn is a 26 y.o. [id number] at 3888w3d being seen today for ongoing prenatal care.  She is currently monitored for the following issues for this low-risk pregnancy and has Asthma; Supervision of normal pregnancy, antepartum; Low set nipples; and Family history of breast cancer on her problem list.  Patient reports backache, no bleeding, no leaking and occasional contractions.  Contractions: Irregular. Vag. Bleeding: None.  Movement: Present. Denies leaking of fluid.   The following portions of the patient's history were reviewed and updated as appropriate: allergies, current medications, past family history, past medical history, past social history, past surgical history and problem list. Problem list updated.  Objective:   Vitals:   08/16/16 1404  BP: 132/76  Pulse: 91  Weight: 204 lb (92.5 kg)    Fetal Status: Fetal Heart Rate (bpm): NST Fundal Height: 40 cm Movement: Present  Presentation: Vertex  General:  Alert, oriented and cooperative. Patient is in no acute distress.  Skin: Skin is warm and dry. No rash noted.   Cardiovascular: Normal heart rate noted  Respiratory: Normal respiratory effort, no problems with respiration noted  Abdomen: Soft, gravid, appropriate for gestational age. Pain/Pressure: Present     Pelvic:  Cervical exam performed Dilation: 2 Effacement (%): 20 Station: -3  Extremities: Normal range of motion.  Edema: Trace  Mental Status: Normal mood and affect. Normal behavior. Normal judgment and thought content.  NST: + accels, no decels, moderate variability, Cat. 1 tracing. No contractions on toco.   Assessment and Plan:  Pregnancy: [id number] at 3988w3d  1. Supervision of other normal pregnancy, antepartum     Reactive NST.  IOL scheduled for 08/20/16 at 0800.   - Fetal nonstress test  Term labor symptoms and general obstetric precautions including but not limited to vaginal bleeding, contractions, leaking of fluid  and fetal movement were reviewed in detail with the patient. Please refer to After Visit Summary for other counseling recommendations.  Return in about 4 weeks (around 09/13/2016) for Postpartum.   Roe Coombsachelle A Oralia Criger, [name]

## 2016-08-20 ENCOUNTER — Inpatient Hospital Stay (HOSPITAL_COMMUNITY): Payer: Medicaid Other | Admitting: Anesthesiology

## 2016-08-20 ENCOUNTER — Encounter (HOSPITAL_COMMUNITY): Payer: Self-pay

## 2016-08-20 ENCOUNTER — Inpatient Hospital Stay (HOSPITAL_COMMUNITY)
Admission: RE | Admit: 2016-08-20 | Discharge: 2016-08-23 | DRG: 775 | Disposition: A | Discharge status: Home / Self Care | Payer: Medicaid Other | Source: Ambulatory Visit | Attending: Obstetrics & Gynecology | Admitting: Obstetrics & Gynecology

## 2016-08-20 DIAGNOSIS — Z3A41 41 weeks gestation of pregnancy: Secondary | ICD-10-CM | POA: Diagnosis not present

## 2016-08-20 DIAGNOSIS — O480 Post-term pregnancy: Principal | ICD-10-CM | POA: Diagnosis present

## 2016-08-20 DIAGNOSIS — J45909 Unspecified asthma, uncomplicated: Secondary | ICD-10-CM | POA: Diagnosis present

## 2016-08-20 DIAGNOSIS — Z87891 Personal history of nicotine dependence: Secondary | ICD-10-CM

## 2016-08-20 DIAGNOSIS — O9952 Diseases of the respiratory system complicating childbirth: Secondary | ICD-10-CM | POA: Diagnosis present

## 2016-08-20 DIAGNOSIS — Z91040 Latex allergy status: Secondary | ICD-10-CM

## 2016-08-20 LAB — CBC
HCT: 34.1 % — ABNORMAL LOW (ref 36.0–46.0)
Hemoglobin: 11.2 g/dL — ABNORMAL LOW (ref 12.0–15.0)
MCH: 26.5 pg (ref 26.0–34.0)
MCHC: 32.8 g/dL (ref 30.0–36.0)
MCV: 80.8 fL (ref 78.0–100.0)
PLATELETS: 216 10*3/uL (ref 150–400)
RBC: 4.22 MIL/uL (ref 3.87–5.11)
RDW: 15.9 % — ABNORMAL HIGH (ref 11.5–15.5)
WBC: 10.4 10*3/uL (ref 4.0–10.5)

## 2016-08-20 LAB — TYPE AND SCREEN
ABO/RH(D): O POS
Antibody Screen: NEGATIVE

## 2016-08-20 LAB — RPR: RPR: NONREACTIVE

## 2016-08-20 MED ORDER — FENTANYL 2.5 MCG/ML BUPIVACAINE 1/10 % EPIDURAL INFUSION (WH - ANES)
14.0000 mL/h | INTRAMUSCULAR | Status: DC | PRN
  Administered 2016-08-20 (×2): 14 mL/h via EPIDURAL
  Filled 2016-08-20 (×2): qty 100

## 2016-08-20 MED ORDER — LIDOCAINE HCL (PF) 1 % IJ SOLN
30.0000 mL | INTRAMUSCULAR | Status: DC | PRN
  Filled 2016-08-20: qty 30

## 2016-08-20 MED ORDER — OXYCODONE-ACETAMINOPHEN 5-325 MG PO TABS: 2.0000 | ORAL_TABLET | ORAL | Status: DC | PRN

## 2016-08-20 MED ORDER — TERBUTALINE SULFATE 1 MG/ML IJ SOLN
0.2500 mg | Freq: Once | INTRAMUSCULAR | Status: DC | PRN
  Filled 2016-08-20: qty 1

## 2016-08-20 MED ORDER — OXYTOCIN 40 UNITS IN LACTATED RINGERS INFUSION - SIMPLE MED
1.0000 m[IU]/min | INTRAVENOUS | Status: DC
  Administered 2016-08-20: 2 m[IU]/min via INTRAVENOUS
  Filled 2016-08-20: qty 1000

## 2016-08-20 MED ORDER — OXYCODONE-ACETAMINOPHEN 5-325 MG PO TABS: 1.0000 | ORAL_TABLET | ORAL | Status: DC | PRN

## 2016-08-20 MED ORDER — LACTATED RINGERS IV SOLN
INTRAVENOUS | Status: DC
  Administered 2016-08-20: 19:00:00 via INTRAVENOUS
  Administered 2016-08-20 (×2): 125 mL/h via INTRAVENOUS

## 2016-08-20 MED ORDER — OXYTOCIN BOLUS FROM INFUSION
500.0000 mL | Freq: Once | INTRAVENOUS | Status: AC
  Administered 2016-08-21: 500 mL via INTRAVENOUS

## 2016-08-20 MED ORDER — LIDOCAINE HCL (PF) 1 % IJ SOLN
INTRAMUSCULAR | Status: DC | PRN
  Administered 2016-08-20 (×2): 5 mL

## 2016-08-20 MED ORDER — LACTATED RINGERS IV SOLN: 500.0000 mL | Freq: Once | INTRAVENOUS | Status: DC

## 2016-08-20 MED ORDER — PHENYLEPHRINE 40 MCG/ML (10ML) SYRINGE FOR IV PUSH (FOR BLOOD PRESSURE SUPPORT)
80.0000 ug | PREFILLED_SYRINGE | INTRAVENOUS | Status: DC | PRN
  Filled 2016-08-20: qty 5

## 2016-08-20 MED ORDER — ACETAMINOPHEN 325 MG PO TABS: 650.0000 mg | ORAL_TABLET | ORAL | Status: DC | PRN

## 2016-08-20 MED ORDER — LACTATED RINGERS IV SOLN
500.0000 mL | INTRAVENOUS | Status: DC | PRN
  Administered 2016-08-20: 500 mL via INTRAVENOUS

## 2016-08-20 MED ORDER — ONDANSETRON HCL 4 MG/2ML IJ SOLN
4.0000 mg | Freq: Four times a day (QID) | INTRAMUSCULAR | Status: DC | PRN
  Administered 2016-08-20: 4 mg via INTRAVENOUS
  Filled 2016-08-20: qty 2

## 2016-08-20 MED ORDER — DIPHENHYDRAMINE HCL 50 MG/ML IJ SOLN: 12.5000 mg | INTRAMUSCULAR | Status: DC | PRN

## 2016-08-20 MED ORDER — EPHEDRINE 5 MG/ML INJ
10.0000 mg | INTRAVENOUS | Status: DC | PRN
  Filled 2016-08-20: qty 2

## 2016-08-20 MED ORDER — PHENYLEPHRINE 40 MCG/ML (10ML) SYRINGE FOR IV PUSH (FOR BLOOD PRESSURE SUPPORT)
80.0000 ug | PREFILLED_SYRINGE | INTRAVENOUS | Status: DC | PRN
  Filled 2016-08-20: qty 5
  Filled 2016-08-20: qty 10

## 2016-08-20 MED ORDER — PROMETHAZINE HCL 25 MG/ML IJ SOLN
12.5000 mg | Freq: Once | INTRAMUSCULAR | Status: AC
  Administered 2016-08-20: 12.5 mg via INTRAVENOUS
  Filled 2016-08-20: qty 1

## 2016-08-20 MED ORDER — SOD CITRATE-CITRIC ACID 500-334 MG/5ML PO SOLN: 30.0000 mL | ORAL | Status: DC | PRN

## 2016-08-20 MED ORDER — FENTANYL CITRATE (PF) 100 MCG/2ML IJ SOLN: 100.0000 ug | INTRAMUSCULAR | Status: DC | PRN

## 2016-08-20 MED ORDER — OXYTOCIN 40 UNITS IN LACTATED RINGERS INFUSION - SIMPLE MED: 2.5000 [IU]/h | INTRAVENOUS | Status: DC

## 2016-08-20 MED ORDER — FLEET ENEMA 7-19 GM/118ML RE ENEM: 1.0000 | ENEMA | RECTAL | Status: DC | PRN

## 2016-08-20 NOTE — Anesthesia Procedure Notes (Signed)
Epidural Patient location during procedure: OB  Staffing Anesthesiologist: Michalle Rademaker Performed: anesthesiologist   Preanesthetic Checklist Completed: patient identified, site marked, surgical consent, pre-op evaluation, timeout performed, IV checked, risks and benefits discussed and monitors and equipment checked  Epidural Patient position: sitting Prep: DuraPrep Patient monitoring: heart rate, continuous pulse ox and blood pressure Approach: right paramedian Location: L3-L4 Injection technique: LOR saline  Needle:  Needle type: Tuohy  Needle gauge: 17 G Needle length: 9 cm and 9 Needle insertion depth: 7 cm Catheter type: closed end flexible Catheter size: 20 Guage Catheter at skin depth: 11 cm Test dose: negative  Assessment Events: blood not aspirated, injection not painful, no injection resistance, negative IV test and no paresthesia  Additional Notes Patient identified. Risks/Benefits/Options discussed with patient including but not limited to bleeding, infection, nerve damage, paralysis, failed block, incomplete pain control, headache, blood pressure changes, nausea, vomiting, reactions to medication both or allergic, itching and postpartum back pain. Confirmed with bedside nurse the patient's most recent platelet count. Confirmed with patient that they are not currently taking any anticoagulation, have any bleeding history or any family history of bleeding disorders. Patient expressed understanding and wished to proceed. All questions were answered. Sterile technique was used throughout the entire procedure. Please see nursing notes for vital signs. Test dose was given through epidural needle and negative prior to continuing to dose epidural or start infusion. Warning signs of high block given to the patient including shortness of breath, tingling/numbness in hands, complete motor block, or any concerning symptoms with instructions to call for help. Patient was given  instructions on fall risk and not to get out of bed. All questions and concerns addressed with instructions to call with any issues.     

## 2016-08-20 NOTE — Progress Notes (Signed)
Ann Dunn is a 26 y.o. [id number] at 5823w0d by ultrasound admitted for induction of labor due to Post dates. Due date 6/23.  Subjective:   Objective: BP 130/83   Pulse 78   Temp 98.1 F (36.7 C) (Oral)   Resp 16   Ht 5' (1.524 m)   Wt 204 lb (92.5 kg)   LMP 10/26/2015   SpO2 100%   BMI 39.84 kg/m  No intake/output data recorded. No intake/output data recorded.  FHT:  FHR: 120's bpm, variability: moderate,  accelerations:  Present,  decelerations:  Absent UC:   regular, every 2-3 minutes mild intensity SVE:   Dilation: 4 Effacement (%): 80 Station: -3 Exam by:: Doloris HallJenny Middleton RN  Labs: Lab Results  Component Value Date   WBC 10.4 08/20/2016   HGB 11.2 (L) 08/20/2016   HCT 34.1 (L) 08/20/2016   MCV 80.8 08/20/2016   PLT 216 08/20/2016    Assessment / Plan: Induction of labor due to postterm,  progressing well on pitocin  Labor: Progressing normally and not yet in labor Preeclampsia:  no signs or symptoms of toxicity Fetal Wellbeing:  Category I Pain Control:  Labor support without medications I/D:  n/a Anticipated MOD:  NSVD  Ann Dunn 08/20/2016, 12:37 PM

## 2016-08-20 NOTE — H&P (Signed)
Ann Dunn is a 26 y.o. female [id number] @ 41 wks presenting for IOL for post dates. OB History    Gravida Para Term Preterm AB Living   5 2 2   2 2    SAB TAB Ectopic Multiple Live Births   1 1   0 2     Past Medical History:  Diagnosis Date  . Anemia   . Asthma    Triggered by grass and pollen;inhaler prn  . H/O induced abortion 11/27/2011  . H/O spontaneous abortion, currently pregnant 11/27/2011   SAB in Feb 2013  . Headache(784.0)   . PCOS (polycystic ovarian syndrome)   . Vertigo    Not officially dx'd   Past Surgical History:  Procedure Laterality Date  . ARM WOUND REPAIR / CLOSURE    . INDUCED ABORTION     Family History: family history includes Cancer in her maternal grandmother; Hyperlipidemia in her maternal grandmother; Stroke in her maternal grandmother. Social History:  reports that she quit smoking about 5 years ago. Her smoking use included Cigarettes. She smoked 0.00 packs per day. She has never used smokeless tobacco. She reports that she does not drink alcohol or use drugs.     Maternal Diabetes: No Genetic Screening: Normal Maternal Ultrasounds/Referrals: Normal Fetal Ultrasounds or other Referrals:  None Maternal Substance Abuse:  No Significant Maternal Medications:  None Significant Maternal Lab Results:  None Other Comments:  None  Review of Systems  Constitutional: Negative.   HENT: Negative.   Eyes: Negative.   Respiratory: Negative.   Cardiovascular: Negative.   Gastrointestinal: Negative.   Genitourinary: Negative.   Musculoskeletal: Negative.   Skin: Negative.   Neurological: Negative.   Endo/Heme/Allergies: Negative.   Psychiatric/Behavioral: Negative.    Maternal Medical History:  Reason for admission: IOL for post dates  Contractions: none  Fetal activity: Perceived fetal activity is normal.   Last perceived fetal movement was within the past hour.    Prenatal complications: no prenatal complications Prenatal Complications  - Diabetes: none.    Dilation: 2 Effacement (%): 70 Station: -3 Exam by:: Doloris HallJenny Middleton RN Blood pressure 112/67, pulse 84, temperature 98.1 F (36.7 C), temperature source Oral, resp. rate 16, height 5' (1.524 m), weight 204 lb (92.5 kg), last menstrual period 10/26/2015, unknown if currently breastfeeding. Maternal Exam:  Uterine Assessment: Contraction strength is mild.  Contraction frequency is irregular.   Abdomen: Patient reports no abdominal tenderness. Fetal presentation: vertex  Introitus: Normal vulva. Normal vagina.  Ferning test: not done.  Nitrazine test: not done. Amniotic fluid character: not assessed.  Pelvis: adequate for delivery.   Cervix: Cervix evaluated by digital exam.     Fetal Exam Fetal Monitor Review: Mode: ultrasound.   Variability: moderate (6-25 bpm).   Pattern: accelerations present.    Fetal State Assessment: Category I - tracings are normal.     Physical Exam  Constitutional: She is oriented to person, place, and time. She appears well-developed and well-nourished.  HENT:  Head: Normocephalic.  Neck: Normal range of motion.  Cardiovascular: Normal rate, regular rhythm, normal heart sounds and intact distal pulses.   Respiratory: Effort normal and breath sounds normal.  GI: Soft. Bowel sounds are normal.  Genitourinary: Vagina normal and uterus normal.  Musculoskeletal: Normal range of motion.  Neurological: She is alert and oriented to person, place, and time. She has normal reflexes.  Skin: Skin is warm and dry.  Psychiatric: She has a normal mood and affect. Her behavior is normal. Judgment and  thought content normal.    Prenatal labs: ABO, Rh: --/--/O POS (07/01 1610) Antibody: PENDING (07/01 9604) Rubella: 3.84 (11/14 1643) RPR: Non Reactive (04/04 1115)  HBsAg: Negative (11/14 1643)  HIV: Non Reactive (04/04 1115)  GBS: Negative (05/31 1531)   Assessment/Plan: VSS, GBS neg, SVE 2/70/-3 vertex. FHR pattern reassuring.  Will start pit induction of labor.   Wyvonnia Dusky 08/20/2016, 9:24 AM

## 2016-08-20 NOTE — Anesthesia Preprocedure Evaluation (Signed)
Anesthesia Evaluation  Patient identified by MRN, date of birth, ID band Patient awake    Reviewed: Allergy & Precautions, H&P , NPO status , Patient's Chart, lab work & pertinent test results  History of Anesthesia Complications Negative for: history of anesthetic complications  Airway Mallampati: II  TM Distance: >3 FB Neck ROM: full    Dental no notable dental hx. (+) Teeth Intact   Pulmonary asthma , former smoker,    Pulmonary exam normal breath sounds clear to auscultation       Cardiovascular negative cardio ROS Normal cardiovascular exam Rhythm:regular Rate:Normal     Neuro/Psych negative neurological ROS  negative psych ROS   GI/Hepatic negative GI ROS, Neg liver ROS,   Endo/Other  negative endocrine ROS  Renal/GU negative Renal ROS  negative genitourinary   Musculoskeletal   Abdominal   Peds  Hematology negative hematology ROS (+)   Anesthesia Other Findings   Reproductive/Obstetrics (+) Pregnancy                             Anesthesia Physical Anesthesia Plan  ASA: II  Anesthesia Plan: Epidural   Post-op Pain Management:    Induction:   PONV Risk Score and Plan:   Airway Management Planned:   Additional Equipment:   Intra-op Plan:   Post-operative Plan:   Informed Consent: I have reviewed the patients History and Physical, chart, labs and discussed the procedure including the risks, benefits and alternatives for the proposed anesthesia with the patient or authorized representative who has indicated his/her understanding and acceptance.     Plan Discussed with:   Anesthesia Plan Comments:         Anesthesia Quick Evaluation  

## 2016-08-20 NOTE — Progress Notes (Signed)
Wynn Maudlinyerra D Fairburn is a 26 y.o. [id number] at 764w0d by ultrasound admitted for induction of labor due to Post dates. Due date 6/23.  Subjective:   Objective: BP 133/75 (BP Location: Right Arm)   Pulse 68   Temp 98.1 F (36.7 C) (Oral)   Resp 16   Ht 5' (1.524 m)   Wt 204 lb (92.5 kg)   LMP 10/26/2015   SpO2 100%   BMI 39.84 kg/m  No intake/output data recorded. No intake/output data recorded.  FHT:  FHR: 120 bpm, variability: moderate,  accelerations:  Present,  decelerations:  Absent UC:   regular, every 4-6 minutes SVE:   Dilation: 8 Effacement (%): 90 Station: -1 Exam by:: Doloris HallJenny Middleton RN  Labs: Lab Results  Component Value Date   WBC 10.4 08/20/2016   HGB 11.2 (L) 08/20/2016   HCT 34.1 (L) 08/20/2016   MCV 80.8 08/20/2016   PLT 216 08/20/2016    Assessment / Plan: Induction of labor due to postterm,  progressing well on pitocin  Labor: Progressing normally Preeclampsia:  no signs or symptoms of toxicity Fetal Wellbeing:  Category I Pain Control:  Epidural I/D:  n/a Anticipated MOD:  NSVD  Wyvonnia DuskyMarie Nakai Yard 08/20/2016, 5:09 PM

## 2016-08-20 NOTE — Anesthesia Pain Management Evaluation Note (Signed)
  CRNA Pain Management Visit Note  Patient: Ann Dunn, 26 y.o., female  "Hello I am a [facility]. We have an anesthesia team available at all times to provide care throughout the hospital, including epidural management and anesthesia for C-section. I don't know your plan for the delivery whether it a natural birth, water birth, IV sedation, nitrous supplementation, doula or epidural, but we want to meet your pain goals."   1.Was your pain managed to your expectations on prior hospitalizations?   Yes   2.What is your expectation for pain management during this hospitalization?     Epidural  3.How can we help you reach that goal?   Record the patient's initial score and the patient's pain goal.   Pain: 6  Pain Goal: 8 The [facility] wants you to be able to say your pain was always managed very well.  Laban EmperorMalinova,Nikyla Navedo Hristova 08/20/2016

## 2016-08-21 ENCOUNTER — Encounter (HOSPITAL_COMMUNITY): Payer: Self-pay

## 2016-08-21 ENCOUNTER — Encounter (HOSPITAL_COMMUNITY): Payer: Self-pay | Admitting: Certified Registered Nurse Anesthetist

## 2016-08-21 ENCOUNTER — Encounter (HOSPITAL_COMMUNITY)
Admission: RE | Disposition: A | Discharge status: Home / Self Care | Payer: Self-pay | Source: Ambulatory Visit | Attending: Obstetrics & Gynecology

## 2016-08-21 DIAGNOSIS — Z3A41 41 weeks gestation of pregnancy: Secondary | ICD-10-CM

## 2016-08-21 DIAGNOSIS — O480 Post-term pregnancy: Secondary | ICD-10-CM

## 2016-08-21 LAB — CBC
HCT: 33.7 % — ABNORMAL LOW (ref 36.0–46.0)
Hemoglobin: 11.1 g/dL — ABNORMAL LOW (ref 12.0–15.0)
MCH: 26.2 pg (ref 26.0–34.0)
MCHC: 32.9 g/dL (ref 30.0–36.0)
MCV: 79.7 fL (ref 78.0–100.0)
Platelets: 194 10*3/uL (ref 150–400)
RBC: 4.23 MIL/uL (ref 3.87–5.11)
RDW: 15.7 % — ABNORMAL HIGH (ref 11.5–15.5)
WBC: 20.2 10*3/uL — ABNORMAL HIGH (ref 4.0–10.5)

## 2016-08-21 SURGERY — LIGATION, FALLOPIAN TUBE, POSTPARTUM
Anesthesia: Choice

## 2016-08-21 MED ORDER — OXYCODONE-ACETAMINOPHEN 5-325 MG PO TABS
1.0000 | ORAL_TABLET | ORAL | Status: DC | PRN
  Administered 2016-08-22: 1 via ORAL
  Filled 2016-08-21: qty 1

## 2016-08-21 MED ORDER — ZOLPIDEM TARTRATE 5 MG PO TABS: 5.0000 mg | ORAL_TABLET | Freq: Every evening | ORAL | Status: DC | PRN

## 2016-08-21 MED ORDER — IBUPROFEN 600 MG PO TABS
600.0000 mg | ORAL_TABLET | Freq: Four times a day (QID) | ORAL | Status: DC
  Administered 2016-08-21 – 2016-08-23 (×9): 600 mg via ORAL
  Filled 2016-08-21 (×9): qty 1

## 2016-08-21 MED ORDER — PRENATAL MULTIVITAMIN CH
1.0000 | ORAL_TABLET | Freq: Every day | ORAL | Status: DC
  Administered 2016-08-21 – 2016-08-22 (×2): 1 via ORAL
  Filled 2016-08-21 (×2): qty 1

## 2016-08-21 MED ORDER — SENNOSIDES-DOCUSATE SODIUM 8.6-50 MG PO TABS
2.0000 | ORAL_TABLET | ORAL | Status: DC
  Administered 2016-08-21 – 2016-08-22 (×2): 2 via ORAL
  Filled 2016-08-21 (×2): qty 2

## 2016-08-21 MED ORDER — SIMETHICONE 80 MG PO CHEW: 80.0000 mg | CHEWABLE_TABLET | ORAL | Status: DC | PRN

## 2016-08-21 MED ORDER — WITCH HAZEL-GLYCERIN EX PADS: 1.0000 "application " | MEDICATED_PAD | CUTANEOUS | Status: DC | PRN

## 2016-08-21 MED ORDER — METOCLOPRAMIDE HCL 10 MG PO TABS
10.0000 mg | ORAL_TABLET | Freq: Once | ORAL | Status: DC
  Filled 2016-08-21: qty 1

## 2016-08-21 MED ORDER — ONDANSETRON HCL 4 MG/2ML IJ SOLN: 4.0000 mg | INTRAMUSCULAR | Status: DC | PRN

## 2016-08-21 MED ORDER — ONDANSETRON HCL 4 MG PO TABS: 4.0000 mg | ORAL_TABLET | ORAL | Status: DC | PRN

## 2016-08-21 MED ORDER — FAMOTIDINE 20 MG PO TABS
40.0000 mg | ORAL_TABLET | Freq: Once | ORAL | Status: DC
  Filled 2016-08-21: qty 2

## 2016-08-21 MED ORDER — OXYCODONE-ACETAMINOPHEN 5-325 MG PO TABS: 2.0000 | ORAL_TABLET | ORAL | Status: DC | PRN

## 2016-08-21 MED ORDER — DIBUCAINE 1 % RE OINT: 1.0000 "application " | TOPICAL_OINTMENT | RECTAL | Status: DC | PRN

## 2016-08-21 MED ORDER — BENZOCAINE-MENTHOL 20-0.5 % EX AERO: 1.0000 "application " | INHALATION_SPRAY | CUTANEOUS | Status: DC | PRN

## 2016-08-21 MED ORDER — COCONUT OIL OIL: 1.0000 "application " | TOPICAL_OIL | Status: DC | PRN

## 2016-08-21 MED ORDER — DIPHENHYDRAMINE HCL 25 MG PO CAPS: 25.0000 mg | ORAL_CAPSULE | Freq: Four times a day (QID) | ORAL | Status: DC | PRN

## 2016-08-21 MED ORDER — LACTATED RINGERS IV SOLN: INTRAVENOUS | Status: DC

## 2016-08-21 MED ORDER — ACETAMINOPHEN 325 MG PO TABS: 650.0000 mg | ORAL_TABLET | ORAL | Status: DC | PRN

## 2016-08-21 MED ORDER — TETANUS-DIPHTH-ACELL PERTUSSIS 5-2.5-18.5 LF-MCG/0.5 IM SUSP
0.5000 mL | Freq: Once | INTRAMUSCULAR | Status: DC
  Filled 2016-08-21: qty 0.5

## 2016-08-21 NOTE — Progress Notes (Signed)
Delivery Note At 12:06 AM a viable female was delivered via Vaginal, Spontaneous Delivery (Presentation:MOA). FHR up until crowing between 90bpm and 150bpm. During crowing heart rate was in the 70-150 bpm with moderate variability.   Patient crowned for 3 pushes and did not deliver; right shoulder dystocia resolved with McRoberts and maternal effort. Total shoulder dystocia time less than 30 seconds.   APGAR: 2, 7; weight  Pending.  Infant placed on maternal abdomen for initial assessment and bulb suction. FHR 100 by cord palpation, however infant did not respond to stimulus and had low tone. Immediately transferred to the warmer and Code Apgar was called (See NICU note).   Placenta status: delivered with gentle traction; intact. Placenta sent to pathology.  Cord: 3VC. with the following complications: None.  Cord pH: 7.256.  Anesthesia:  Epidral Episiotomy: None Lacerations: None Suture Repair: NA Est. Blood Loss (mL): 50  Mom to postpartum.  Baby to Couplet care / Skin to Skin.  Ann GaribaldiKathryn Lorraine Laurelle Dunn 08/21/2016, 12:59 AM

## 2016-08-21 NOTE — Progress Notes (Signed)
Post Partum Day #0 Subjective: up ad lib, voiding and NPO for BTL  Objective: Blood pressure 132/66, pulse 80, temperature 98 F (36.7 C), temperature source Oral, resp. rate 18, height 5' (1.524 m), weight 204 lb (92.5 kg), last menstrual period 10/26/2015, SpO2 100 %, unknown if currently breastfeeding.  Physical Exam:  General: alert, cooperative and no distress Lochia: appropriate Uterine Fundus: firm Incision: none DVT Evaluation: No evidence of DVT seen on physical exam. No cords or calf tenderness. No significant calf/ankle edema.   Recent Labs  08/20/16 0812 08/21/16 0549  HGB 11.2* 11.1*  HCT 34.1* 33.7*    Assessment/Plan: Plan for discharge tomorrow, Breastfeeding and Contraception BTL planned for today, NPO status   LOS: 1 day   Roe CoombsRachelle A Urie Loughner, [name] 08/21/2016, 7:45 AM

## 2016-08-21 NOTE — Anesthesia Postprocedure Evaluation (Signed)
Anesthesia Post Note  Patient: Ann Dunn  Procedure(s) Performed: * No procedures listed *     Patient location during evaluation: Mother Baby Anesthesia Type: Epidural Level of consciousness: awake, awake and alert, oriented and patient cooperative Pain management: pain level controlled Vital Signs Assessment: post-procedure vital signs reviewed and stable Respiratory status: spontaneous breathing, nonlabored ventilation and respiratory function stable Cardiovascular status: stable Postop Assessment: no headache, no backache, patient able to bend at knees and no signs of nausea or vomiting Anesthetic complications: no    Last Vitals:  Vitals:   08/21/16 0200 08/21/16 0345  BP: (!) 122/93 132/66  Pulse: 75 80  Resp: 16 18  Temp: 36.9 C 36.7 C    Last Pain:  Vitals:   08/21/16 0546  TempSrc:   PainSc: 0-No pain   Pain Goal:                 Sutton Plake L

## 2016-08-21 NOTE — Lactation Note (Addendum)
This note was copied from a baby's chart. Lactation Consultation Note  Patient Name: Ann Dunn ZOXWR'UToday's Date: 08/21/2016 Reason for consult: Initial assessment  Visited with 3rd time Mom, baby 3214 hrs old.  Baby has breast fed X 4 (per Mom), and one syringe feeding of 10 ml formula. As Mom was NPO for a tubal, and hadn't eaten in many hours, and wanted baby fed formula.  Baby dressed and sucking on pacifier in crib.  Offered assistance with latching baby as baby was cueing.  Mom shared that she was cramping too much and she didn't want to latch baby.  Explained to Mom that babies often cluster feed, and latching her when she cues would be a good idea.   RN set up DEBP at bedside with instructions to pump both breasts if baby is getting formula.  Asked Mom about pumping, as pump behind bed.  Mom isn't interested in pumping.  Brochure left with Mom.  Informed her of IP and OP lactation services.  Encouraged Mom to call for assistance as needed.  Consult Status Consult Status: Follow-up Date: 08/22/16 Follow-up type: In-patient    Ann Dunn, Ciella Obi E 08/21/2016, 3:28 PM

## 2016-08-21 NOTE — Progress Notes (Signed)
Called to patient's room, patient requesting a bottle of formula for infant.  Infant had nursed for 20 minutes and was skin to skin with MOB with a pacifier in her mouth upon entering the room.  MOB stated that she did not think the baby was getting anything, and that she had "called for a bottle 20 minutes ago and that lady did not bring it."    After getting permission from MOB, I demonstrated to her that she does have colostrum for baby.   Patient expressed that she still wanted to give some formula because she does not want to be worried about whether she is getting enough.  RN offered to hand express with her, but patient did not want to, stating "It is whatever."  Patient is very uncomfortable at the moment due to wanting to eat, but also trying to hold out for her tubal that will be later today.  After discussing options with patient, patient decided to hold off on the tubal for now, with the possibility of either having it tomorrow morning or outpatient.  We also gave formula to infant, using a syringe.  RN also educated MOB on feeding infant instead of using the pacifier at this time, since infant was hungry.  MOB educated that Pacifiers can cause infant to feel as though she had eaten, even when she hadn't as well as cause nipple confusion.  Discussed plan with lactation before feeding infant formula.  MD called and notified that patient would like to eat right now and hold off on tubal.  Will continue to monitor.

## 2016-08-22 MED ORDER — OXYCODONE-ACETAMINOPHEN 5-325 MG PO TABS: 1.0000 | ORAL_TABLET | ORAL | 0 refills | Status: DC | PRN

## 2016-08-22 MED ORDER — IBUPROFEN 600 MG PO TABS: 600.0000 mg | ORAL_TABLET | Freq: Four times a day (QID) | ORAL | 2 refills | Status: DC

## 2016-08-22 NOTE — Discharge Instructions (Signed)

## 2016-08-22 NOTE — Discharge Summary (Signed)
OB Discharge Summary     Patient Name: Ann Dunn DOB: 1991-02-03 MRN: [id number]  Date of admission: 08/20/2016 Delivering MD: Marylene Land   Date of discharge: 08/22/2016  Admitting diagnosis: 41wks induction  Desires Sterilization Intrauterine pregnancy: [redacted]w[redacted]d     Secondary diagnosis:  Active Problems:   Post-dates pregnancy  Additional problems: none     Discharge diagnosis: Term Pregnancy Delivered                                                                                                Post partum procedures:none  Augmentation: AROM and Pitocin  Complications: short shoulder dystocia  Hospital course:  Induction of Labor With Vaginal Delivery   26 y.o. yo [id number] at [redacted]w[redacted]d was admitted to the hospital 08/20/2016 for induction of labor.  Indication for induction: Postdates.  Patient had an uncomplicated labor course as follows: Membrane Rupture Time/Date: 3:29 PM ,08/20/2016   Intrapartum Procedures: Episiotomy: None [1]                                         Lacerations:  None [1]  Patient had delivery of a Viable infant.  Information for the patient's newborn:  Katalena, Malveaux [[id number]]  Delivery Method: Vaginal, Spontaneous Delivery (Filed from Delivery Summary)   08/21/2016  Details of delivery can be found in separate delivery note.  Patient had a routine postpartum course. Patient is discharged home 08/22/16.  Physical exam  Vitals:   08/21/16 0345 08/21/16 0912 08/21/16 1741 08/22/16 0524  BP: 132/66 122/78 (!) 142/79 107/63  Pulse: 80 79 72 73  Resp: 18  20 20   Temp: 98 F (36.7 C) 98.1 F (36.7 C) 98.3 F (36.8 C) 97.8 F (36.6 C)  TempSrc: Oral Oral Axillary Oral  SpO2:      Weight:      Height:       General: alert, cooperative and no distress Lochia: appropriate Uterine Fundus: firm Incision: N/A DVT Evaluation: No evidence of DVT seen on physical exam. No cords or calf tenderness. No significant calf/ankle  edema. Labs: Lab Results  Component Value Date   WBC 20.2 (H) 08/21/2016   HGB 11.1 (L) 08/21/2016   HCT 33.7 (L) 08/21/2016   MCV 79.7 08/21/2016   PLT 194 08/21/2016   CMP Latest Ref Rng & Units 02/11/2014  Glucose 70 - 99 mg/dL 96  BUN 6 - 23 mg/dL 7  Creatinine 1.30 - 8.65 mg/dL 7.84(O)  Sodium 962 - 952 mmol/L 132(L)  Potassium 3.5 - 5.1 mmol/L 2.9(L)  Chloride 96 - 112 mEq/L 101  CO2 19 - 32 mmol/L 25  Calcium 8.4 - 10.5 mg/dL 8.6  Total Protein 6.0 - 8.3 g/dL 6.9  Total Bilirubin 0.3 - 1.2 mg/dL 1.0  Alkaline Phos 39 - 117 U/L 69  AST 0 - 37 U/L 25  ALT 0 - 35 U/L 19    Discharge instruction: per After Visit Summary and "Baby and Me Booklet".  After visit meds:  Allergies as of 08/22/2016      Reactions   Latex Rash      Medication List    TAKE these medications   CITRANATAL HARMONY 27-1-260 MG Caps Take 1 capsule by mouth daily.   ibuprofen 600 MG tablet Commonly known as:  ADVIL,MOTRIN Take 1 tablet (600 mg total) by mouth every 6 (six) hours.   oxyCODONE-acetaminophen 5-325 MG tablet Commonly known as:  PERCOCET/ROXICET Take 1-2 tablets by mouth every 4 (four) hours as needed (pain scale > 7).       Diet: routine diet  Activity: Advance as tolerated. Pelvic rest for 6 weeks.   Outpatient follow up:4 weeks Follow up Appt:Future Appointments Date Time Provider Department Center  09/13/2016 10:30 AM Orvilla Cornwallenney, Sadarius Norman A, [name] [facility] None   Follow up Visit:No Follow-up on file.  Postpartum contraception: Tubal Ligation, interval  Newborn Data: Live born female  Birth Weight: 8 lb 11.7 oz (3960 g) APGAR: 2, 7  Baby Feeding: Breast Disposition:home with mother   08/22/2016 Roe Coombsachelle A Yzabelle Calles, [name]

## 2016-08-22 NOTE — Lactation Note (Signed)
This note was copied from a baby's chart. Lactation Consultation Note  P3, Baby 35 hours old and sleepy.  Unwrapped baby for feeding.  Baby spitty. Mother states baby has been breastfeeding well on L side 9 (everted) more than R side (inverts when compressed). Attempted latching on L and R side but baby mouthed nipple but did not suck.  Not interested in breastfeeding at this time. Encouraged STS and hand expression often. Had mother prepump with manual pump on L side and hand express prior to latching. Attempted with #24NS with no success. Encouraged mother to keep trying on R side. Mother has been pumping with DEBP.  Discussed supply and demand. Mother states she would like to rent pump tomorrow upon discharge.  Has WIC appt next Monday. [facility]. Mom encouraged to feed baby 8-12 times/24 hours and with feeding cues.    Patient Name: Ann Dunn ZOXWR'UToday's Date: 08/22/2016 Reason for consult: Follow-up assessment   Maternal Data    Feeding    LATCH Score/Interventions                      Lactation Tools Discussed/Used     Consult Status Consult Status: Follow-up Date: 08/23/16 Follow-up type: In-patient    Dahlia ByesBerkelhammer, Ruth [facility] 08/22/2016, 12:00 PM

## 2016-08-22 NOTE — Progress Notes (Signed)
Post Partum Day #1 Subjective: up ad lib, voiding, tolerating PO and reports cramping  Objective: Blood pressure 107/63, pulse 73, temperature 97.8 F (36.6 C), temperature source Oral, resp. rate 20, height 5' (1.524 m), weight 204 lb (92.5 kg), last menstrual period 10/26/2015, SpO2 100 %, unknown if currently breastfeeding.  Physical Exam:  General: alert, cooperative and no distress Lochia: appropriate Uterine Fundus: firm Incision: none DVT Evaluation: No evidence of DVT seen on physical exam. No cords or calf tenderness. No significant calf/ankle edema.   Recent Labs  08/20/16 0812 08/21/16 0549  HGB 11.2* 11.1*  HCT 34.1* 33.7*    Assessment/Plan: Plan for discharge tomorrow, Breastfeeding and Contraception interval BTL   LOS: 2 days   Roe CoombsRachelle A Vashon Riordan, [name] 08/22/2016, 8:11 AM

## 2016-08-22 NOTE — Progress Notes (Signed)
Patient ID: Ann Dunn, female   DOB: Sep 18, 1990, 26 y.o.   MRN: [id number]  Patient decided she would like to stay another night. Baby is getting XRay of clavicle due to some crepitous noted, therefore peds would like to keep the baby one more day. Discharge discontinued.   Cleda ClarksElizabeth W. Loredana Medellin, DO  OB Fellow

## 2016-08-23 NOTE — Discharge Summary (Signed)
OB Discharge Summary                           Patient Name: Ann Dunn DOB: October 14, 1990 MRN: [id number]  Date of admission: 08/20/2016 Delivering MD: Marylene LandKOOISTRA, KATHRYN LORRAINE   Date of discharge: 08/22/2016  Admitting diagnosis: 41wks induction  Desires Sterilization Intrauterine pregnancy: 1915w1d     Secondary diagnosis:  Active Problems:   Post-dates pregnancy  Additional problems: none                                      Discharge diagnosis: Term Pregnancy Delivered                                                                                                Post partum procedures:none  Augmentation: AROM and Pitocin  Complications: short shoulder dystocia  Hospital course:  Induction of Labor With Vaginal Delivery   26 y.o. yo [id number] at 6216w1d was admitted to the hospital 08/20/2016 for induction of labor.  Indication for induction: Postdates.  Patient had an uncomplicated labor course as follows: Membrane Rupture Time/Date: 3:29 PM ,08/20/2016   Intrapartum Procedures: Episiotomy: None [1]                                         Lacerations:  None [1]  Patient had delivery of a Viable infant.  Information for the patient's newborn:  Venia CarbonJones, Girl Kameisha [[id number]][[id number]]  Delivery Method: Vaginal, Spontaneous Delivery (Filed from Delivery Summary)   08/21/2016  Details of delivery can be found in separate delivery note.  Patient had a routine postpartum course. Patient is discharged home 08/22/16.  Physical exam        Vitals:   08/21/16 0345 08/21/16 0912 08/21/16 1741 08/22/16 0524  BP: 132/66 122/78 (!) 142/79 107/63  Pulse: 80 79 72 73  Resp: 18  20 20   Temp: 98 F (36.7 C) 98.1 F (36.7 C) 98.3 F (36.8 C) 97.8 F (36.6 C)  TempSrc: Oral Oral Axillary Oral  SpO2:      Weight:      Height:       General: alert, cooperative and no distress Lochia: appropriate Uterine Fundus: firm Incision: N/A DVT Evaluation: No  evidence of DVT seen on physical exam. No cords or calf tenderness. No significant calf/ankle edema. Labs: RecentLabs       Lab Results  Component Value Date   WBC 20.2 (H) 08/21/2016   HGB 11.1 (L) 08/21/2016   HCT 33.7 (L) 08/21/2016   MCV 79.7 08/21/2016   PLT 194 08/21/2016     CMP Latest Ref Rng & Units 02/11/2014  Glucose 70 - 99 mg/dL 96  BUN 6 - 23 mg/dL 7  Creatinine 6.570.50 - 8.461.10 mg/dL 9.62(X0.33(L)  Sodium 528135 - 413145 mmol/L 132(L)  Potassium 3.5 - 5.1 mmol/L 2.9(L)  Chloride 96 - 112 mEq/L 101  CO2  19 - 32 mmol/L 25  Calcium 8.4 - 10.5 mg/dL 8.6  Total Protein 6.0 - 8.3 g/dL 6.9  Total Bilirubin 0.3 - 1.2 mg/dL 1.0  Alkaline Phos 39 - 117 U/L 69  AST 0 - 37 U/L 25  ALT 0 - 35 U/L 19    Discharge instruction: per After Visit Summary and "Baby and Me Booklet".  After visit meds:      Allergies as of 08/22/2016      Reactions   Latex Rash         Medication List    TAKE these medications   CITRANATAL HARMONY 27-1-260 MG Caps Take 1 capsule by mouth daily.   ibuprofen 600 MG tablet Commonly known as:  ADVIL,MOTRIN Take 1 tablet (600 mg total) by mouth every 6 (six) hours.   oxyCODONE-acetaminophen 5-325 MG tablet Commonly known as:  PERCOCET/ROXICET Take 1-2 tablets by mouth every 4 (four) hours as needed (pain scale > 7).       Diet: routine diet  Activity: Advance as tolerated. Pelvic rest for 6 weeks.   Outpatient follow up:4 weeks Follow up Appt:Future Appointments Date Time Provider Department Center  09/13/2016 10:30 AM Or[place]illa Cornwall A, [name] [facility] None   Follow up Visit:No Follow-up on file.  Postpartum contraception: Tubal Ligation, interval  Newborn Data: Live born female  Birth Weight: 8 lb 11.7 oz (3960 g) APGAR: 2, 7  Baby Feeding: Breast Disposition:home with mother

## 2016-08-23 NOTE — Lactation Note (Signed)
This note was copied from a baby's chart. Lactation Consultation Note  Patient Name: Ann Dunn GNFAO'ZToday's Date: 08/23/2016   Visited with Mom on day of discharge, baby 1857 hrs old.  Baby 6% weight loss.  Mom offering some formula, but states her breasts are filling and baby is breastfeeding more.  Encouraged STS and feeding often on cue.  Reminded her of supply and demand.  Goal of 8-12 feedings per 24 hrs.  Mom aware of OP lactation services, and encouraged to call prn.   [name], Rheya Minogue E 08/23/2016, 10:01 AM

## 2016-08-23 NOTE — Lactation Note (Signed)
This note was copied from a baby's chart. Lactation Consultation Note  Patient Name: Ann Starr Lakeyerra Hai RUEAV'WToday's Date: 08/23/2016 Reason for consult: Pump [facility] Pump has a due date of 09/04/16.  Lurline HareRichey, [name] 08/23/2016, 11:14 AM

## 2016-08-25 ENCOUNTER — Inpatient Hospital Stay (HOSPITAL_COMMUNITY): Admission: RE | Admit: 2016-08-25 | Payer: Medicaid Other | Source: Ambulatory Visit

## 2016-08-30 ENCOUNTER — Other Ambulatory Visit: Payer: Self-pay | Admitting: Certified Nurse Midwife

## 2016-09-13 ENCOUNTER — Ambulatory Visit (INDEPENDENT_AMBULATORY_CARE_PROVIDER_SITE_OTHER): Payer: Medicaid Other | Admitting: Certified Nurse Midwife

## 2016-09-13 ENCOUNTER — Encounter: Payer: Self-pay | Admitting: Certified Nurse Midwife

## 2016-09-13 VITALS — BP 110/74 | HR 98 | Wt 192.0 lb

## 2016-09-13 DIAGNOSIS — Z3002 Counseling and instruction in natural family planning to avoid pregnancy: Secondary | ICD-10-CM

## 2016-09-13 NOTE — Progress Notes (Signed)
Post Partum Exam  Ann Dunn is a 26 y.o. [id number] female who presents for a postpartum visit. She is 3 weeks postpartum following a spontaneous vaginal delivery. I have fully reviewed the prenatal and intrapartum course. The delivery was at 41.1 gestational weeks.  Anesthesia: epidural. Postpartum course has been doing well. Baby's course has been doing well. Baby is feeding by both breast and bottle - Soy. Bleeding staining only. Bowel function is abnormal: some mild constipation. Bladder function is normal. Patient is not sexually active.  Contraception method is abstinence. Pt would like to discuss Progesterone only birth control. Postpartum depression screening:neg, score 0.  The following portions of the patient's history were reviewed and updated as appropriate: allergies, current medications, past family history, past medical history, past social history, past surgical history and problem list.  Review of Systems Pertinent items noted in HPI and remainder of comprehensive ROS otherwise negative.    Objective:  unknown if currently breastfeeding.  General:  alert, cooperative and no distress   Breasts:  inspection negative, no nipple discharge or bleeding, no masses or nodularity palpable  Lungs: clear to auscultation bilaterally  Heart:  regular rate and rhythm, S1, S2 normal, no murmur, click, rub or gallop  Abdomen: soft, non-tender; bowel sounds normal; no masses,  no organomegaly  Pelvic Exam: Not performed.        Assessment:    Normal 3 week postpartum exam. Pap smear not done at today's visit.   contraception counseling  Plan:   1. Contraception: abstinence 2. Contraception options discussed, desires POP.  Out of work until 10/04/16.   3. Follow up in: 3 weeks for PP exam/POP and return to work note or as needed.

## 2016-10-03 ENCOUNTER — Ambulatory Visit: Payer: Medicaid Other | Admitting: Certified Nurse Midwife

## 2016-10-09 ENCOUNTER — Ambulatory Visit: Payer: Medicaid Other | Admitting: Certified Nurse Midwife

## 2016-10-09 ENCOUNTER — Encounter: Payer: Self-pay | Admitting: Certified Nurse Midwife

## 2016-10-09 VITALS — BP 117/72 | HR 67 | Ht 60.0 in | Wt 197.9 lb

## 2016-10-09 DIAGNOSIS — Z3002 Counseling and instruction in natural family planning to avoid pregnancy: Secondary | ICD-10-CM

## 2016-10-09 NOTE — Progress Notes (Signed)
Patient is in the office for [facility] pills and states that she had unprotected sex on Saturday 10-07-16. Provider advised pt to re-schedule in time for lab work to determine pregnancy. Pt stated that she would rather schedule in 2 weeks for upt.

## 2016-10-25 ENCOUNTER — Ambulatory Visit: Payer: Medicaid Other | Admitting: Certified Nurse Midwife

## 2017-05-12 ENCOUNTER — Other Ambulatory Visit: Payer: Self-pay

## 2017-05-12 ENCOUNTER — Encounter (HOSPITAL_COMMUNITY): Payer: Self-pay

## 2017-05-12 ENCOUNTER — Inpatient Hospital Stay (HOSPITAL_COMMUNITY)
Admission: AD | Admit: 2017-05-12 | Discharge: 2017-05-12 | Disposition: A | Discharge status: Home / Self Care | Payer: Medicaid Other | Source: Ambulatory Visit | Attending: Obstetrics and Gynecology | Admitting: Obstetrics and Gynecology

## 2017-05-12 DIAGNOSIS — B9689 Other specified bacterial agents as the cause of diseases classified elsewhere: Secondary | ICD-10-CM

## 2017-05-12 DIAGNOSIS — N760 Acute vaginitis: Secondary | ICD-10-CM | POA: Insufficient documentation

## 2017-05-12 DIAGNOSIS — Z87891 Personal history of nicotine dependence: Secondary | ICD-10-CM | POA: Insufficient documentation

## 2017-05-12 LAB — POCT PREGNANCY, URINE: Preg Test, Ur: NEGATIVE

## 2017-05-12 LAB — URINALYSIS, ROUTINE W REFLEX MICROSCOPIC
Bilirubin Urine: NEGATIVE
Glucose, UA: NEGATIVE mg/dL
Hgb urine dipstick: NEGATIVE
Ketones, ur: NEGATIVE mg/dL
NITRITE: NEGATIVE
PROTEIN: NEGATIVE mg/dL
SPECIFIC GRAVITY, URINE: 1.026 (ref 1.005–1.030)
pH: 6 (ref 5.0–8.0)

## 2017-05-12 LAB — WET PREP, GENITAL
SPERM: NONE SEEN
TRICH WET PREP: NONE SEEN
YEAST WET PREP: NONE SEEN

## 2017-05-12 MED ORDER — METRONIDAZOLE 500 MG PO TABS: 500.0000 mg | ORAL_TABLET | Freq: Two times a day (BID) | ORAL | 0 refills | Status: AC

## 2017-05-12 NOTE — Discharge Instructions (Signed)

## 2017-05-12 NOTE — MAU Note (Signed)
Pt. States for past three days has felt "dry down there, so I used a suppository". States "the change of soap could be the issue, but had unprotected sex 21 days ago". Denies abnormal discharge, foul smelling urine, increase in frequency or urgency. Burning and itching for the first time today. Pt. States possible pregnancy. Lat period on Feb. 13th.

## 2017-05-12 NOTE — MAU Provider Note (Signed)
History   27 yo female in with c/o vaginal irritation x 3 days states switched soaps 3-4 days ago and then having vaginal itching and burning. Denies discharge or odor.  CSN: [id number]  Arrival date & time 05/12/17  1532   None     Chief Complaint  Patient presents with  . Possible Pregnancy  . Vaginitis    Possible    HPI  Past Medical History:  Diagnosis Date  . Anemia   . Asthma    Triggered by grass and pollen;inhaler prn  . H/O induced abortion 11/27/2011  . H/O spontaneous abortion, currently pregnant 11/27/2011   SAB in Feb 2013  . Headache(784.0)   . PCOS (polycystic ovarian syndrome)   . Vertigo    Not officially dx'd    Past Surgical History:  Procedure Laterality Date  . ARM WOUND REPAIR / CLOSURE    . INDUCED ABORTION      Family History  Problem Relation Age of Onset  . Cancer Maternal Grandmother        2016 breast  . Hyperlipidemia Maternal Grandmother   . Stroke Maternal Grandmother        great grandma    Social History   Tobacco Use  . Smoking status: Former Smoker    Packs/day: 0.00    Types: Cigarettes    Last attempt to quit: 08/13/2011    Years since quitting: 5.7  . Smokeless tobacco: Never Used  Substance Use Topics  . Alcohol use: Yes    Alcohol/week: 3.0 oz    Types: 5 Glasses of wine per week  . Drug use: No    Comment: Quit in 07/2011, not since college    OB History    Gravida  5   Para  3   Term  3   Preterm      AB  2   Living  3     SAB  1   TAB  1   Ectopic      Multiple  0   Live Births  3           Review of Systems  Constitutional: Negative.   HENT: Negative.   Eyes: Negative.   Respiratory: Negative.   Cardiovascular: Negative.   Gastrointestinal: Negative.   Endocrine: Negative.   Genitourinary:       Vaginal burning and itching  Musculoskeletal: Negative.   Skin: Negative.   Allergic/Immunologic: Negative.   Neurological: Negative.   Hematological: Negative.    Psychiatric/Behavioral: Negative.     Allergies  Latex  Home Medications    BP 123/76 (BP Location: Left Arm)   Pulse (!) 58   Temp 98 F (36.7 C) (Oral)   Ht 5' (1.524 m)   Wt 178 lb 8 oz (81 kg)   LMP 04/04/2016 (Exact Date)   SpO2 99%   BMI 34.86 kg/m   Physical Exam  Constitutional: She is oriented to person, place, and time. She appears well-developed and well-nourished.  HENT:  Head: Normocephalic.  Neck: Normal range of motion.  Cardiovascular: Normal rate, regular rhythm, normal heart sounds and intact distal pulses.  Pulmonary/Chest: Effort normal and breath sounds normal.  Abdominal: Soft. Bowel sounds are normal.  Genitourinary: Vagina normal and uterus normal.  Musculoskeletal: Normal range of motion.  Neurological: She is alert and oriented to person, place, and time. She has normal reflexes.  Skin: Skin is warm and dry.  Psychiatric: She has a normal mood and affect. Her behavior is  normal. Judgment and thought content normal.    MAU Course  Procedures (including critical care time)  Labs Reviewed  WET PREP, GENITAL  URINALYSIS, ROUTINE W REFLEX MICROSCOPIC  POCT PREGNANCY, URINE  GC/CHLAMYDIA PROBE AMP ([place]   No results found.   Vaginal irritation  BV  MDM  VSS, UPT neg, wet pos clue. Cultures done. Will treat for BV and d/c home

## 2017-05-14 LAB — GC/CHLAMYDIA PROBE AMP (~~LOC~~) NOT AT ARMC
CHLAMYDIA, DNA PROBE: NEGATIVE
Neisseria Gonorrhea: NEGATIVE

## 2017-11-13 IMAGING — US US OB TRANSVAGINAL
1 series · 15 of 28 positions shown · non-contrast
Comparison: None.

CLINICAL DATA: Pregnancy with inconclusive viability.

EXAM:
OBSTETRIC <14 WK US AND TRANSVAGINAL OB US
TECHNIQUE: Both transabdominal and transvaginal ultrasound examinations were
performed for complete evaluation of the gestation as well as the
maternal uterus, adnexal regions, and pelvic cul-de-sac.
Transvaginal technique was performed to assess early pregnancy.

[Series 1: us ob transvaginal · 15 of 28 slices shown]
[im 1/28]
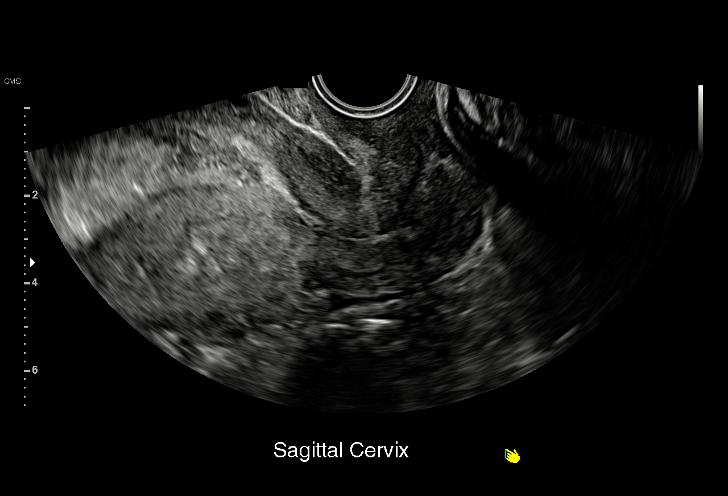
[im 3/28]
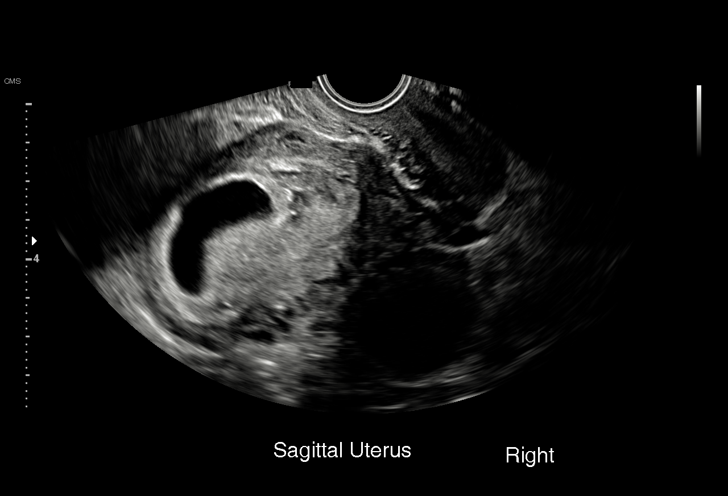
[im 5/28]
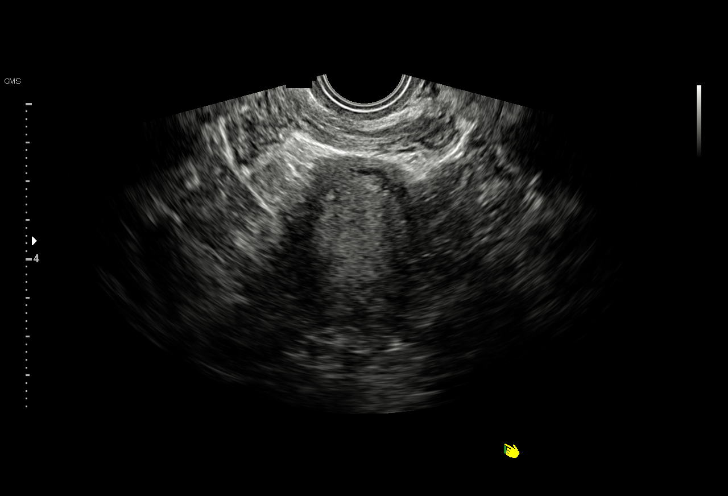
[im 7/28]
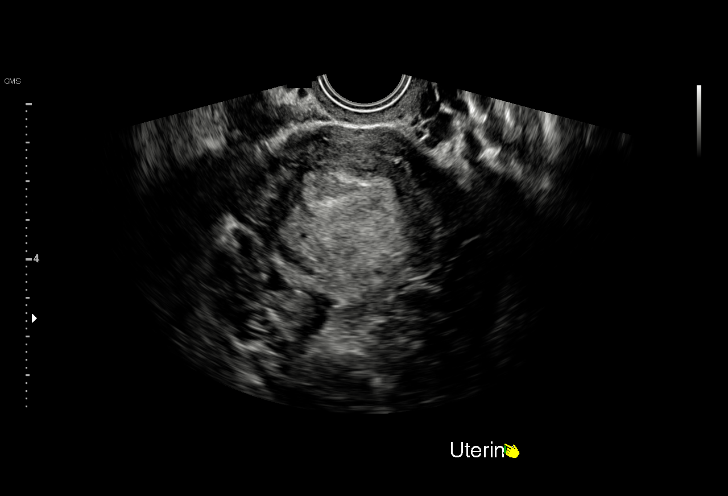
[im 9/28]
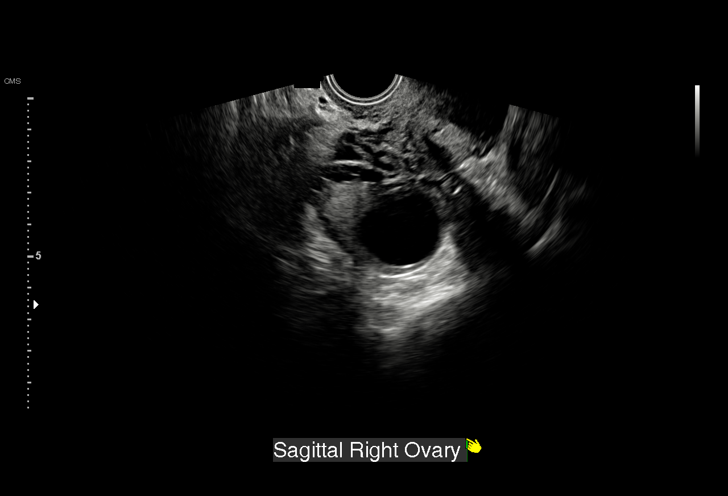
[im 11/28]
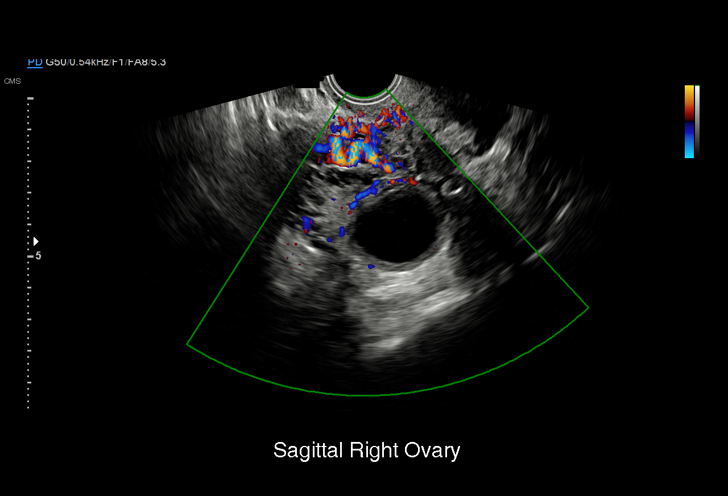
[im 13/28]
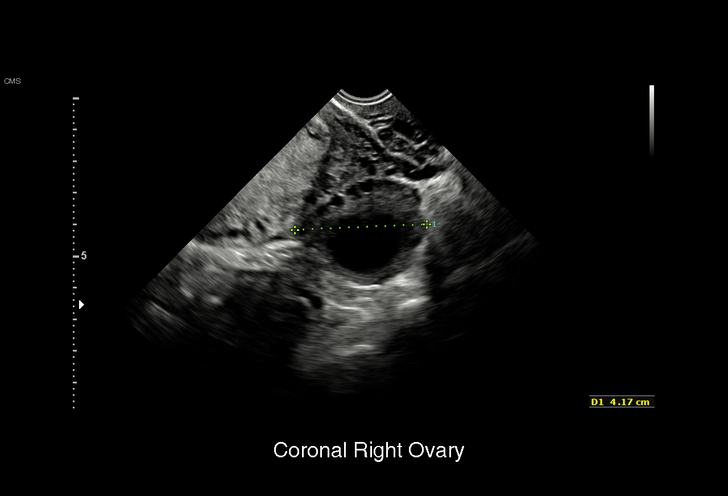
[im 15/28]
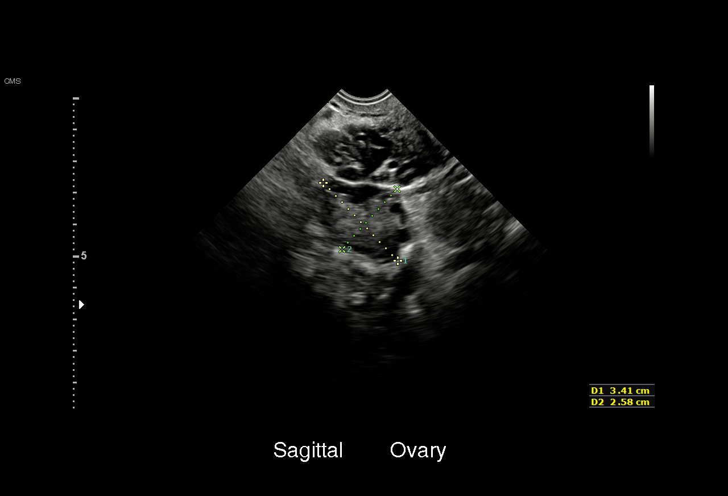
[im 16/28]
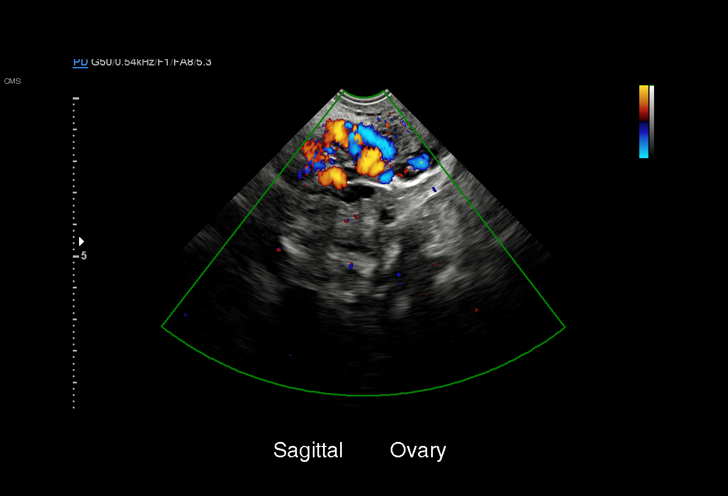
[im 18/28]
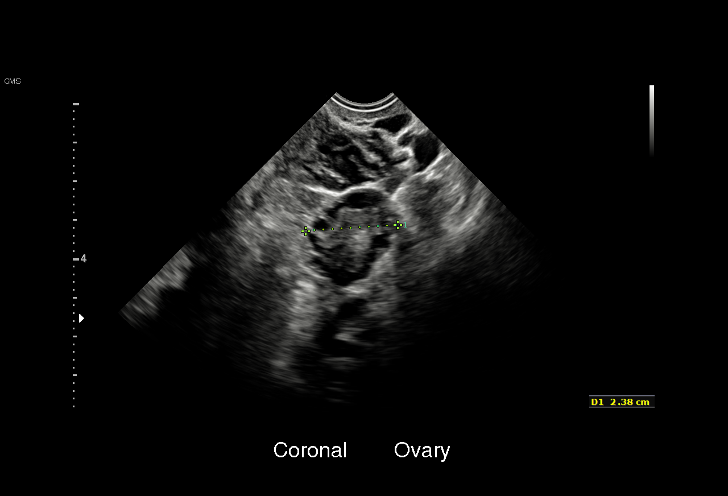
[im 20/28]
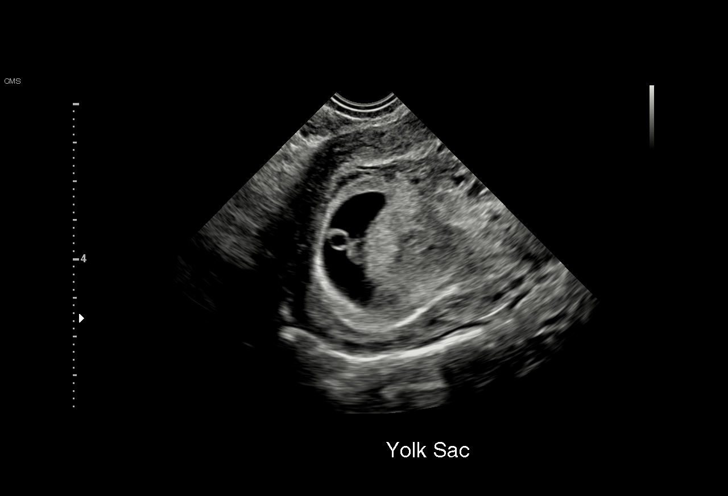
[im 22/28]
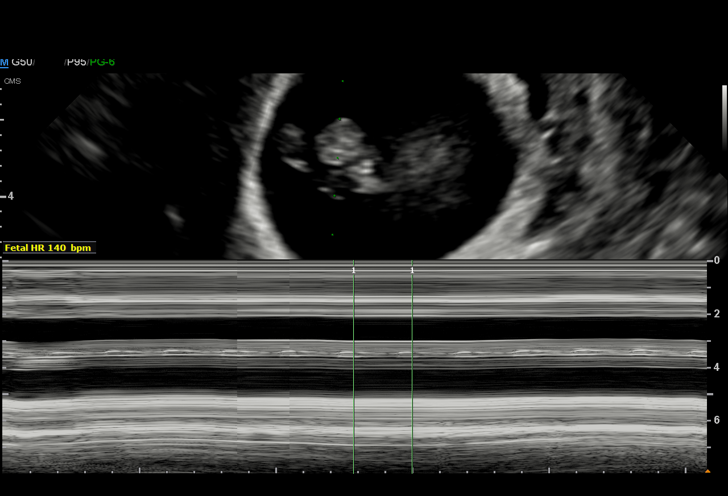
[im 24/28]
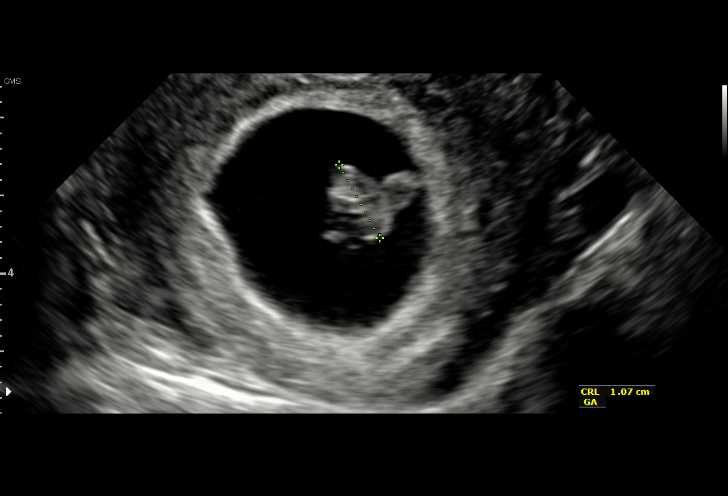
[im 26/28]
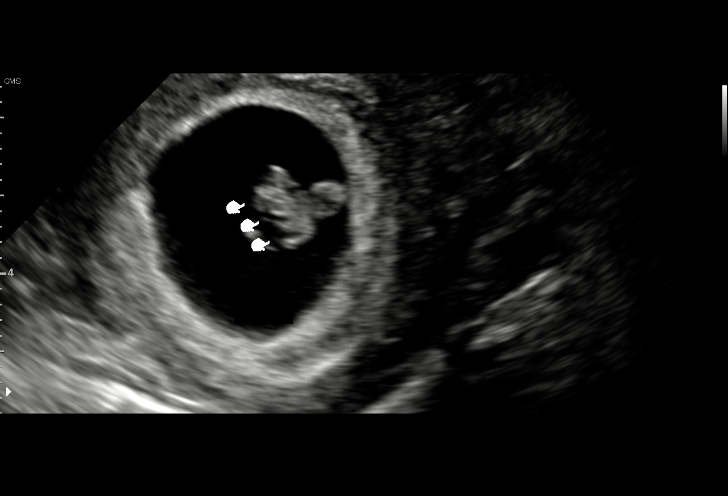
[im 28/28]
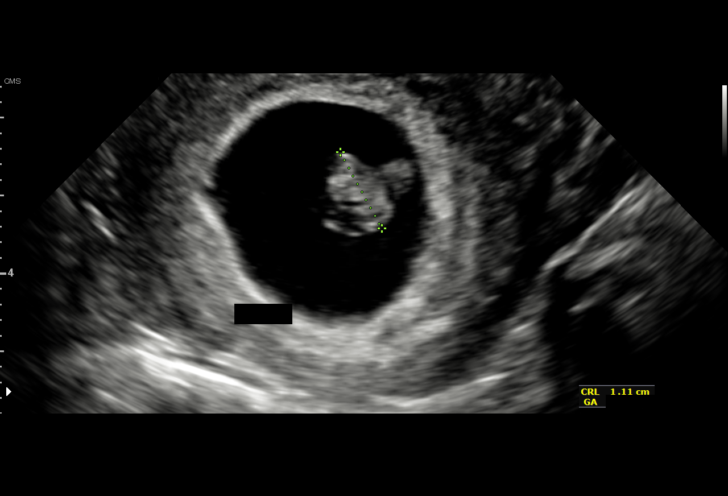

[15 of 28 positions shown; findings below may reference images not displayed]

FINDINGS: Intrauterine gestational sac: Single

Yolk sac:  Visualized.

Embryo:  Visualized.

Cardiac Activity: Visualized.

Heart Rate: 140  bpm

CRL:  11 mm  mm   7 w   1 d                  US EDC: 08/13/2016

Subchorionic hemorrhage:  None visualized.

Maternal uterus/adnexae:

Subchorionic hemorrhage: None

Right ovary: Normal

Left ovary: Normal

Other :None

Free fluid:  None
IMPRESSION: 1. Single living intrauterine gestation. The estimated gestational
age is 7 weeks and 1 day.

## 2018-01-28 ENCOUNTER — Inpatient Hospital Stay (HOSPITAL_COMMUNITY)
Admission: AD | Admit: 2018-01-28 | Discharge: 2018-01-28 | Discharge status: Against Medical Advice | Payer: Self-pay | Attending: Obstetrics & Gynecology | Admitting: Obstetrics & Gynecology

## 2018-01-28 DIAGNOSIS — Z5329 Procedure and treatment not carried out because of patient's decision for other reasons: Secondary | ICD-10-CM | POA: Insufficient documentation

## 2018-01-28 LAB — WET PREP, GENITAL
CLUE CELLS WET PREP: NONE SEEN
SPERM: NONE SEEN
Trich, Wet Prep: NONE SEEN
Yeast Wet Prep HPF POC: NONE SEEN

## 2018-01-28 LAB — URINALYSIS, ROUTINE W REFLEX MICROSCOPIC
BILIRUBIN URINE: NEGATIVE
GLUCOSE, UA: NEGATIVE mg/dL
Hgb urine dipstick: NEGATIVE
KETONES UR: NEGATIVE mg/dL
Leukocytes, UA: NEGATIVE
Nitrite: NEGATIVE
PH: 5 (ref 5.0–8.0)
Protein, ur: NEGATIVE mg/dL
SPECIFIC GRAVITY, URINE: 1.028 (ref 1.005–1.030)

## 2018-01-28 LAB — POCT PREGNANCY, URINE: PREG TEST UR: NEGATIVE

## 2018-01-28 NOTE — MAU Note (Signed)
Registration came back to say patient wanted to leave AMA. Pt signed AMA form.

## 2018-01-28 NOTE — MAU Note (Signed)
Pt states she is here for a pregnancy test and wants to be checked for STDs. Pt denies discharge, but "feels more moist" down there and there is a "mild yeasty smell." Pt denies bleeding or pain. States her LMP 12/24/2017.

## 2018-01-29 LAB — GC/CHLAMYDIA PROBE AMP (~~LOC~~) NOT AT ARMC
Chlamydia: NEGATIVE
NEISSERIA GONORRHEA: NEGATIVE

## 2018-04-10 NOTE — Telephone Encounter (Signed)
Error
